# Patient Record
Sex: Male | Born: 1960 | Race: White | Hispanic: No | Marital: Single | Smoking: Never smoker
Health system: Southern US, Community
[De-identification: ages and names within clinical notes are randomized; demographics above are authoritative.]

## PROBLEM LIST (undated history)

## (undated) DIAGNOSIS — M25512 Pain in left shoulder: Secondary | ICD-10-CM

## (undated) DIAGNOSIS — I10 Essential (primary) hypertension: Secondary | ICD-10-CM

## (undated) DIAGNOSIS — T8859XA Other complications of anesthesia, initial encounter: Secondary | ICD-10-CM

## (undated) DIAGNOSIS — R0789 Other chest pain: Secondary | ICD-10-CM

## (undated) DIAGNOSIS — B3742 Candidal balanitis: Secondary | ICD-10-CM

## (undated) DIAGNOSIS — E119 Type 2 diabetes mellitus without complications: Secondary | ICD-10-CM

## (undated) DIAGNOSIS — G8911 Acute pain due to trauma: Secondary | ICD-10-CM

## (undated) DIAGNOSIS — Z87442 Personal history of urinary calculi: Secondary | ICD-10-CM

## (undated) DIAGNOSIS — IMO0002 Reserved for concepts with insufficient information to code with codable children: Secondary | ICD-10-CM

## (undated) DIAGNOSIS — R7303 Prediabetes: Secondary | ICD-10-CM

## (undated) DIAGNOSIS — K644 Residual hemorrhoidal skin tags: Secondary | ICD-10-CM

## (undated) DIAGNOSIS — E7800 Pure hypercholesterolemia, unspecified: Secondary | ICD-10-CM

## (undated) DIAGNOSIS — T4145XA Adverse effect of unspecified anesthetic, initial encounter: Secondary | ICD-10-CM

## (undated) HISTORY — DX: Candidal balanitis: B37.42

## (undated) HISTORY — DX: Prediabetes: R73.03

## (undated) HISTORY — DX: Pure hypercholesterolemia, unspecified: E78.00

## (undated) HISTORY — PX: CATARACT EXTRACTION: SUR2

## (undated) HISTORY — DX: Other chest pain: R07.89

## (undated) HISTORY — DX: Reserved for concepts with insufficient information to code with codable children: IMO0002

## (undated) HISTORY — PX: ANTERIOR CRUCIATE LIGAMENT REPAIR: SHX115

## (undated) HISTORY — PX: KNEE ARTHROSCOPY: SUR90

## (undated) HISTORY — DX: Essential (primary) hypertension: I10

## (undated) HISTORY — DX: Residual hemorrhoidal skin tags: K64.4

## (undated) HISTORY — PX: EYE SURGERY: SHX253

---

## 2000-10-20 ENCOUNTER — Emergency Department (HOSPITAL_COMMUNITY): Admission: EM | Admit: 2000-10-20 | Discharge: 2000-10-20 | Payer: Self-pay

## 2002-05-02 ENCOUNTER — Encounter: Admission: RE | Admit: 2002-05-02 | Discharge: 2002-05-02 | Payer: Self-pay | Admitting: *Deleted

## 2002-05-02 ENCOUNTER — Encounter: Payer: Self-pay | Admitting: *Deleted

## 2002-10-28 ENCOUNTER — Encounter: Admission: RE | Admit: 2002-10-28 | Discharge: 2002-10-28 | Payer: Self-pay | Admitting: Internal Medicine

## 2002-10-28 ENCOUNTER — Encounter: Payer: Self-pay | Admitting: Internal Medicine

## 2006-07-26 ENCOUNTER — Emergency Department (HOSPITAL_COMMUNITY): Admission: EM | Admit: 2006-07-26 | Discharge: 2006-07-26 | Payer: Self-pay | Admitting: Emergency Medicine

## 2008-03-14 IMAGING — CT CT ABDOMEN W/O CM
2 of 3 series · 17 of 46 positions shown, 19 images · non-contrast
Comparison: None.

HISTORY: Right flank pain.

[Series 2: routine abdomen · axial · 0.78mm/px · z∈[-470,-114]mm · 14 of 83 slices shown, 16 images]
[im 6/83  soft-tissue]
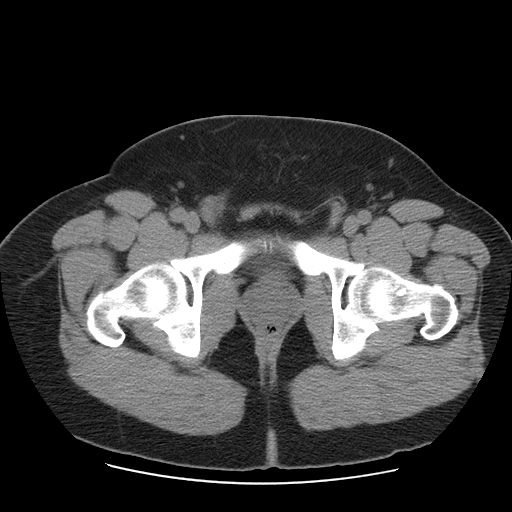
[im 6/83  bone]
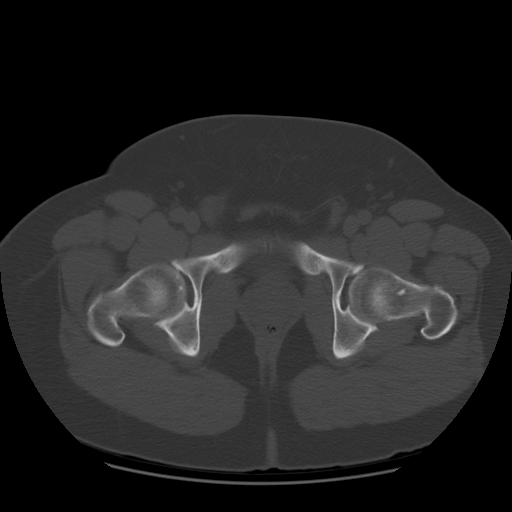
[im 11/83  soft-tissue]
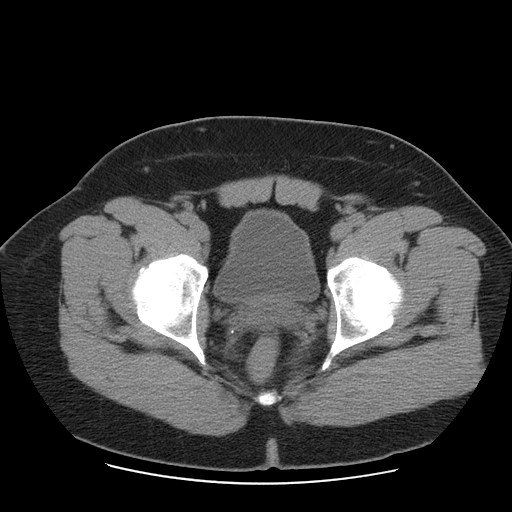
[im 16/83  soft-tissue]
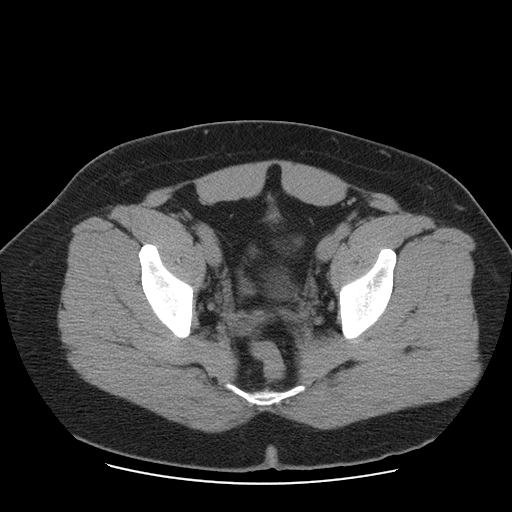
[im 22/83  soft-tissue]
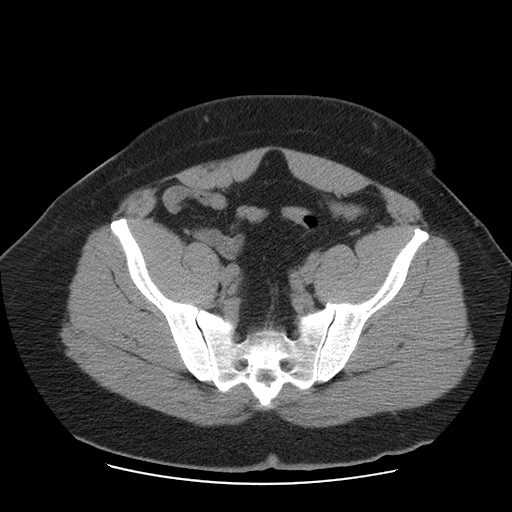
[im 27/83  soft-tissue]
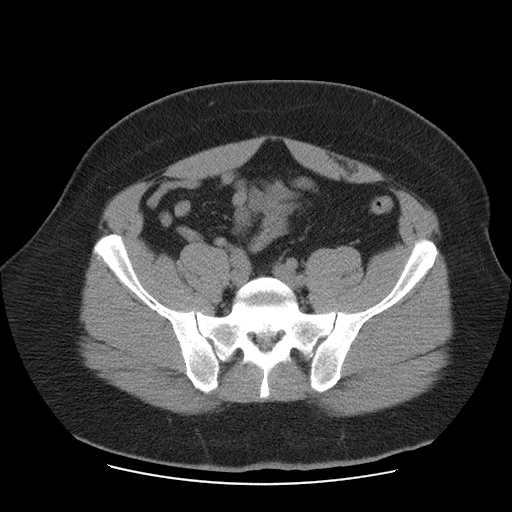
[im 32/83  soft-tissue]
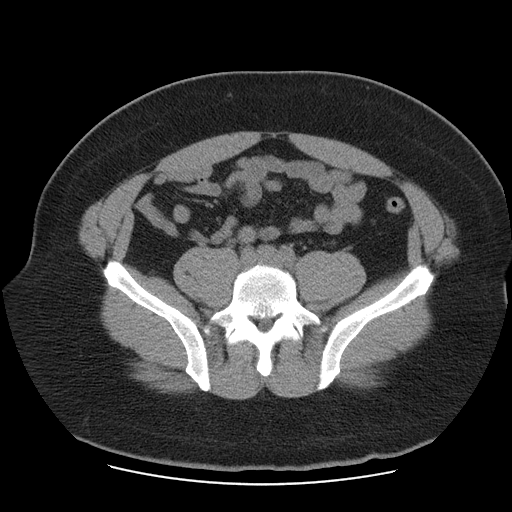
[im 38/83  soft-tissue]
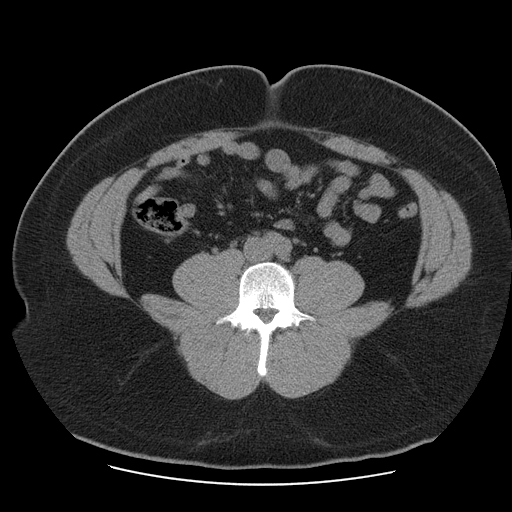
[im 45/83  soft-tissue]
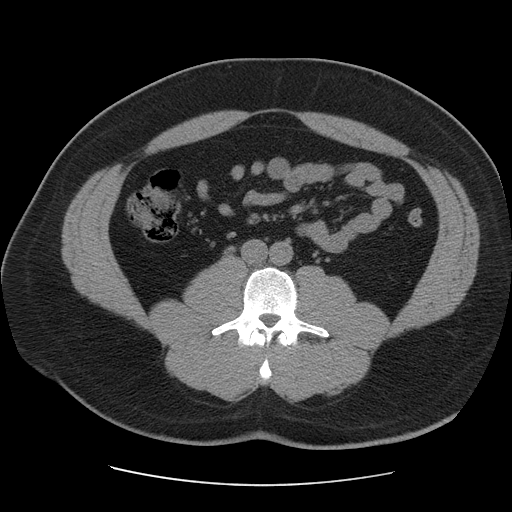
[im 51/83  soft-tissue]
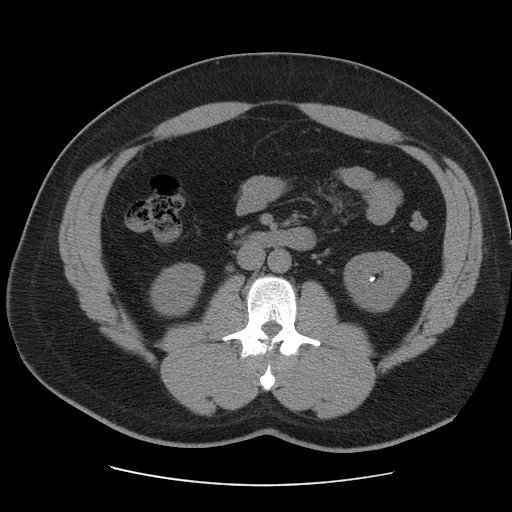
[im 51/83  bone]
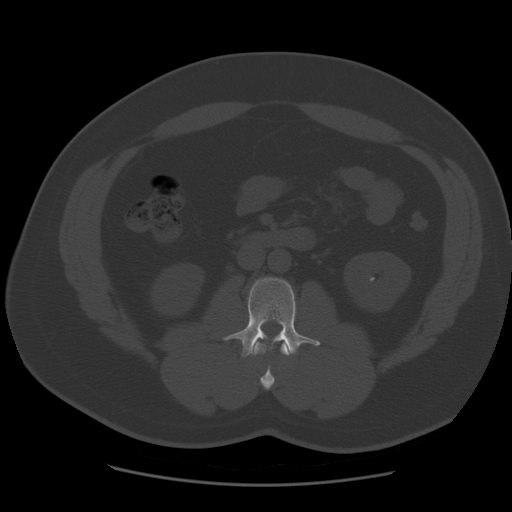
[im 56/83  soft-tissue]
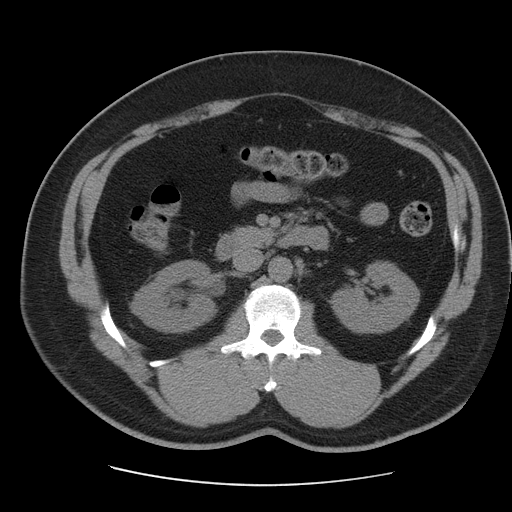
[im 61/83  soft-tissue]
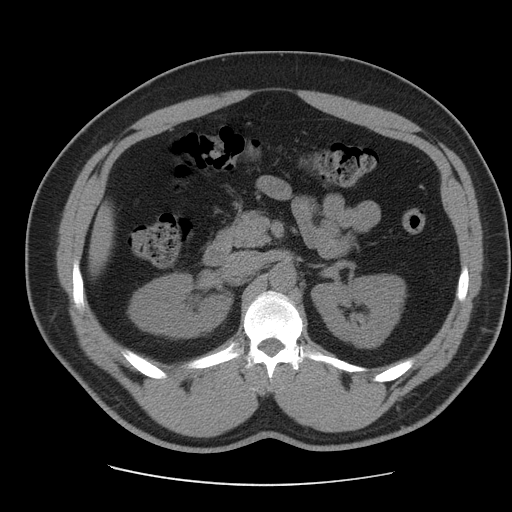
[im 67/83  soft-tissue]
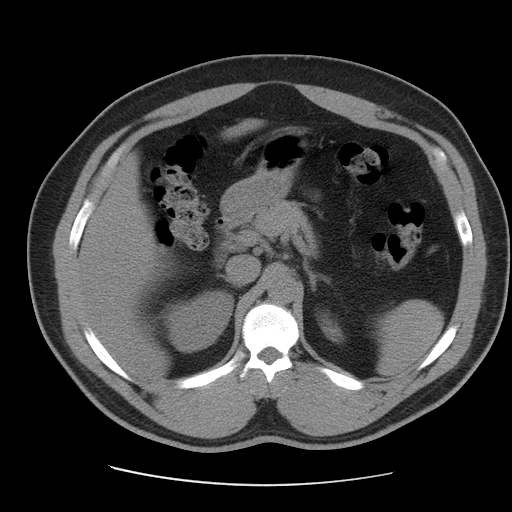
[im 72/83  soft-tissue]
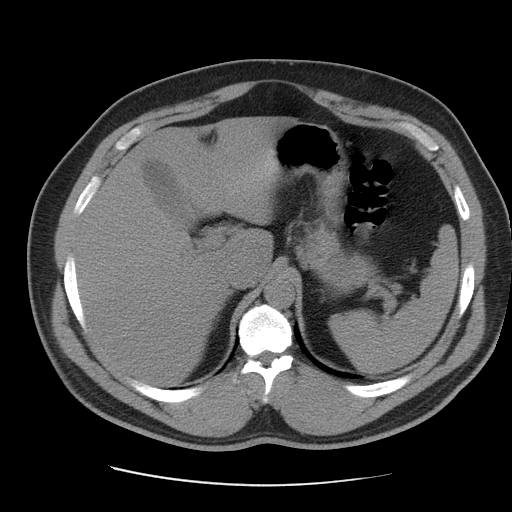
[im 77/83  soft-tissue]
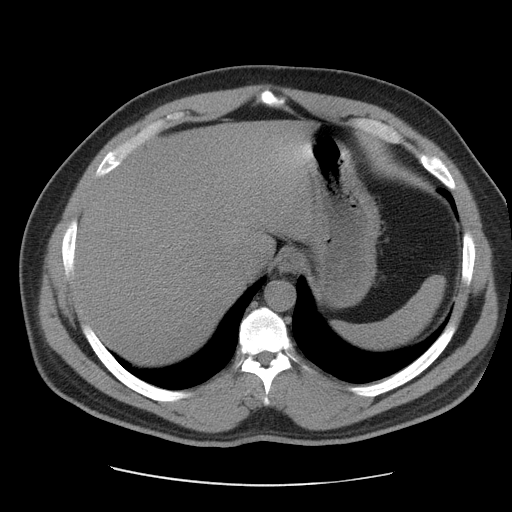

[Series 401: reformatted · coronal · 0.88mm/px · 3 of 133 slices shown]
[im 45/133  soft-tissue]
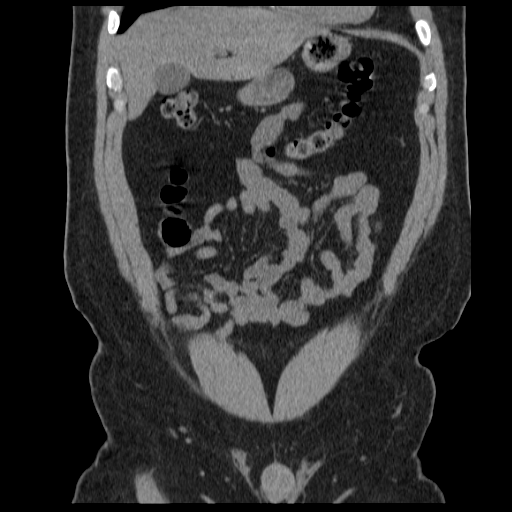
[im 59/133  soft-tissue]
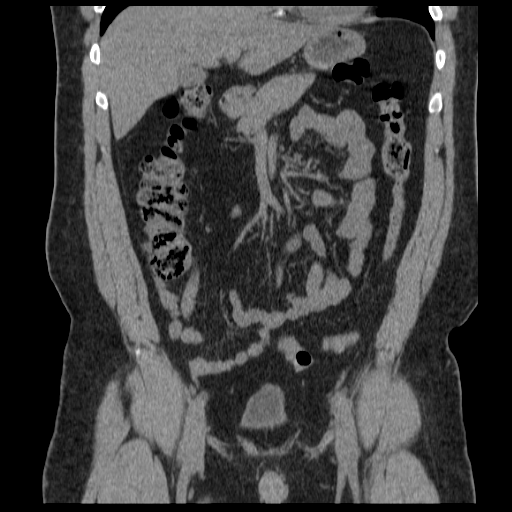
[im 74/133  soft-tissue]
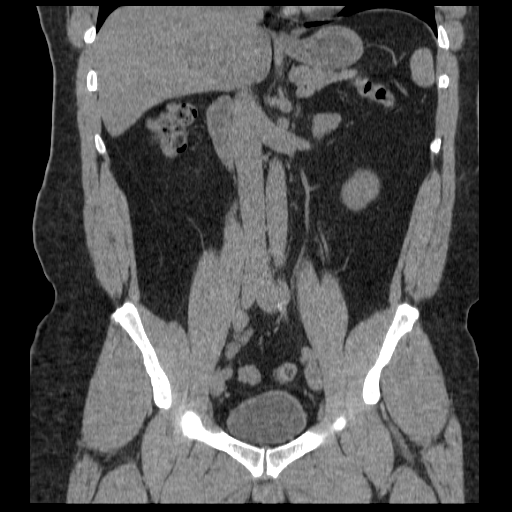

[17 of 46 positions shown; findings below may reference images not displayed]

Examination: Noncontrast CT of the abdomen pelvis was performed using the renal
stone protocol.

Abdomen CT findings: 

Lung bases are clear.

The liver is normal in attenuation and morphology.

Spleen is negative.

Both adrenal glands are negative.

Pancreas is negative.

Small stone measuring 4.7 mm in the lower pole collecting system left kidney.
Upper pole stone measures 3 mm. 

There is no left-sided hydronephrosis, hydroureter or ureterolithiasis.

Mild to moderate right-sided hydronephrosis and hydroureter. Within the urinary
bladder near the right UVJ there is a 2.8 mm stone.

Negative for free fluid.

Negative for retroperitoneal or small bowel mesenteric lymphadenopathy.

The visualized bowel loops are normal in course and caliber without evidence for
bowel obstruction.

Review of the bone windows is unremarkable.
IMPRESSION: 1. Right-sided hydronephrosis secondary to 2.8mm stone at the right UVJ.
2. Left-sided nephrolithiasis without evidence for obstructive uropathy.

Pelvic CT findings: 

2.8 mm stone is identified within the urinary bladder near the right UVJ.

No free fluid.

Negative for pelvic or inguinal lymphadenopathy.

An appendicolith is noted measuring approximately 3.2 mm. There is no evidence
for acute appendicitis.

Review of the bone windows is negative.
IMPRESSION: Bladder stone near the right UVJ

## 2009-08-13 ENCOUNTER — Ambulatory Visit: Payer: Self-pay | Admitting: General Practice

## 2009-08-25 ENCOUNTER — Ambulatory Visit: Payer: Self-pay | Admitting: General Practice

## 2010-05-02 ENCOUNTER — Encounter: Payer: Self-pay | Admitting: *Deleted

## 2010-05-13 ENCOUNTER — Ambulatory Visit: Payer: Self-pay | Admitting: Urology

## 2010-05-20 ENCOUNTER — Ambulatory Visit: Payer: Self-pay | Admitting: Urology

## 2010-06-03 ENCOUNTER — Ambulatory Visit: Payer: Self-pay | Admitting: Urology

## 2010-06-14 ENCOUNTER — Ambulatory Visit: Payer: Self-pay | Admitting: Urology

## 2010-11-01 ENCOUNTER — Ambulatory Visit: Payer: Self-pay | Admitting: Urology

## 2011-04-02 IMAGING — CR DG ABDOMEN 1V
1 series · 2 of 2 positions shown · non-contrast
Comparison: none

REASON FOR EXAM: Lower Back Pain, Chills - SEND FILM WITH PATIENT
COMMENTS:

[Series 1: view not recorded · 0.17mm/px · 2 of 2 slices shown]
[im 1/2]
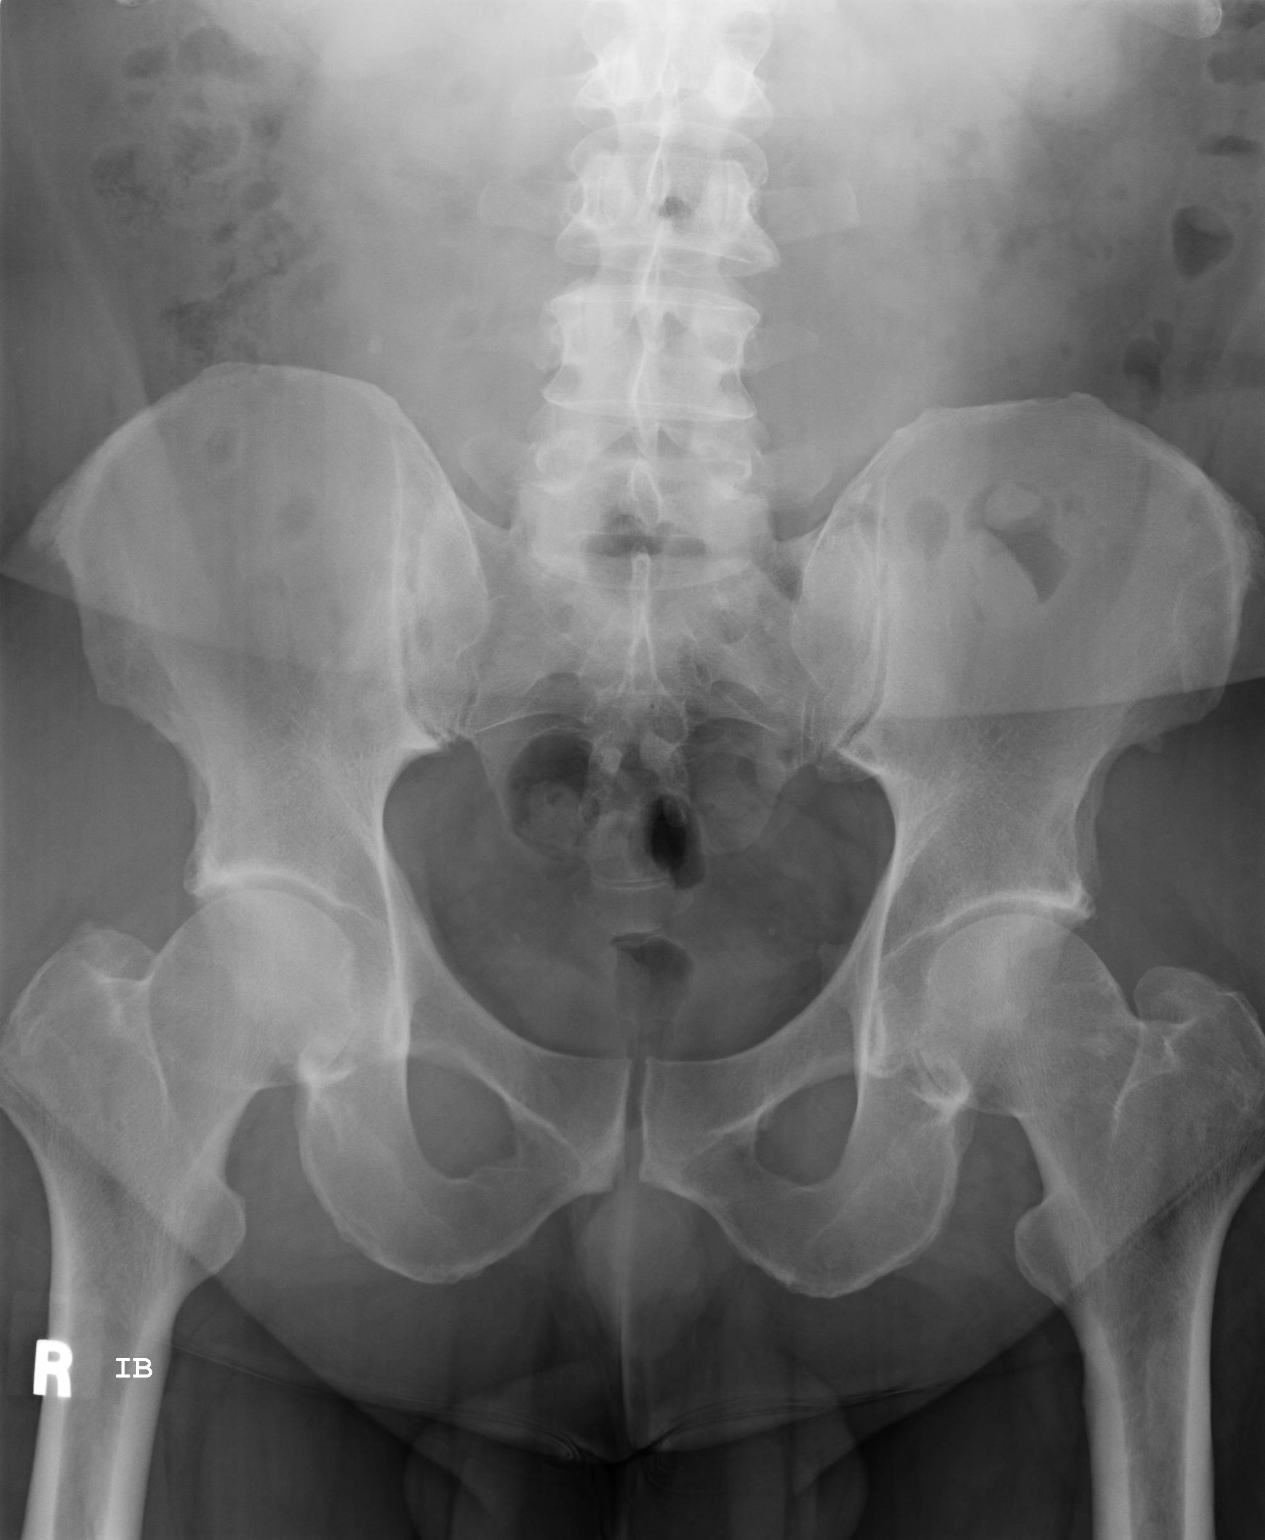
[im 2/2]
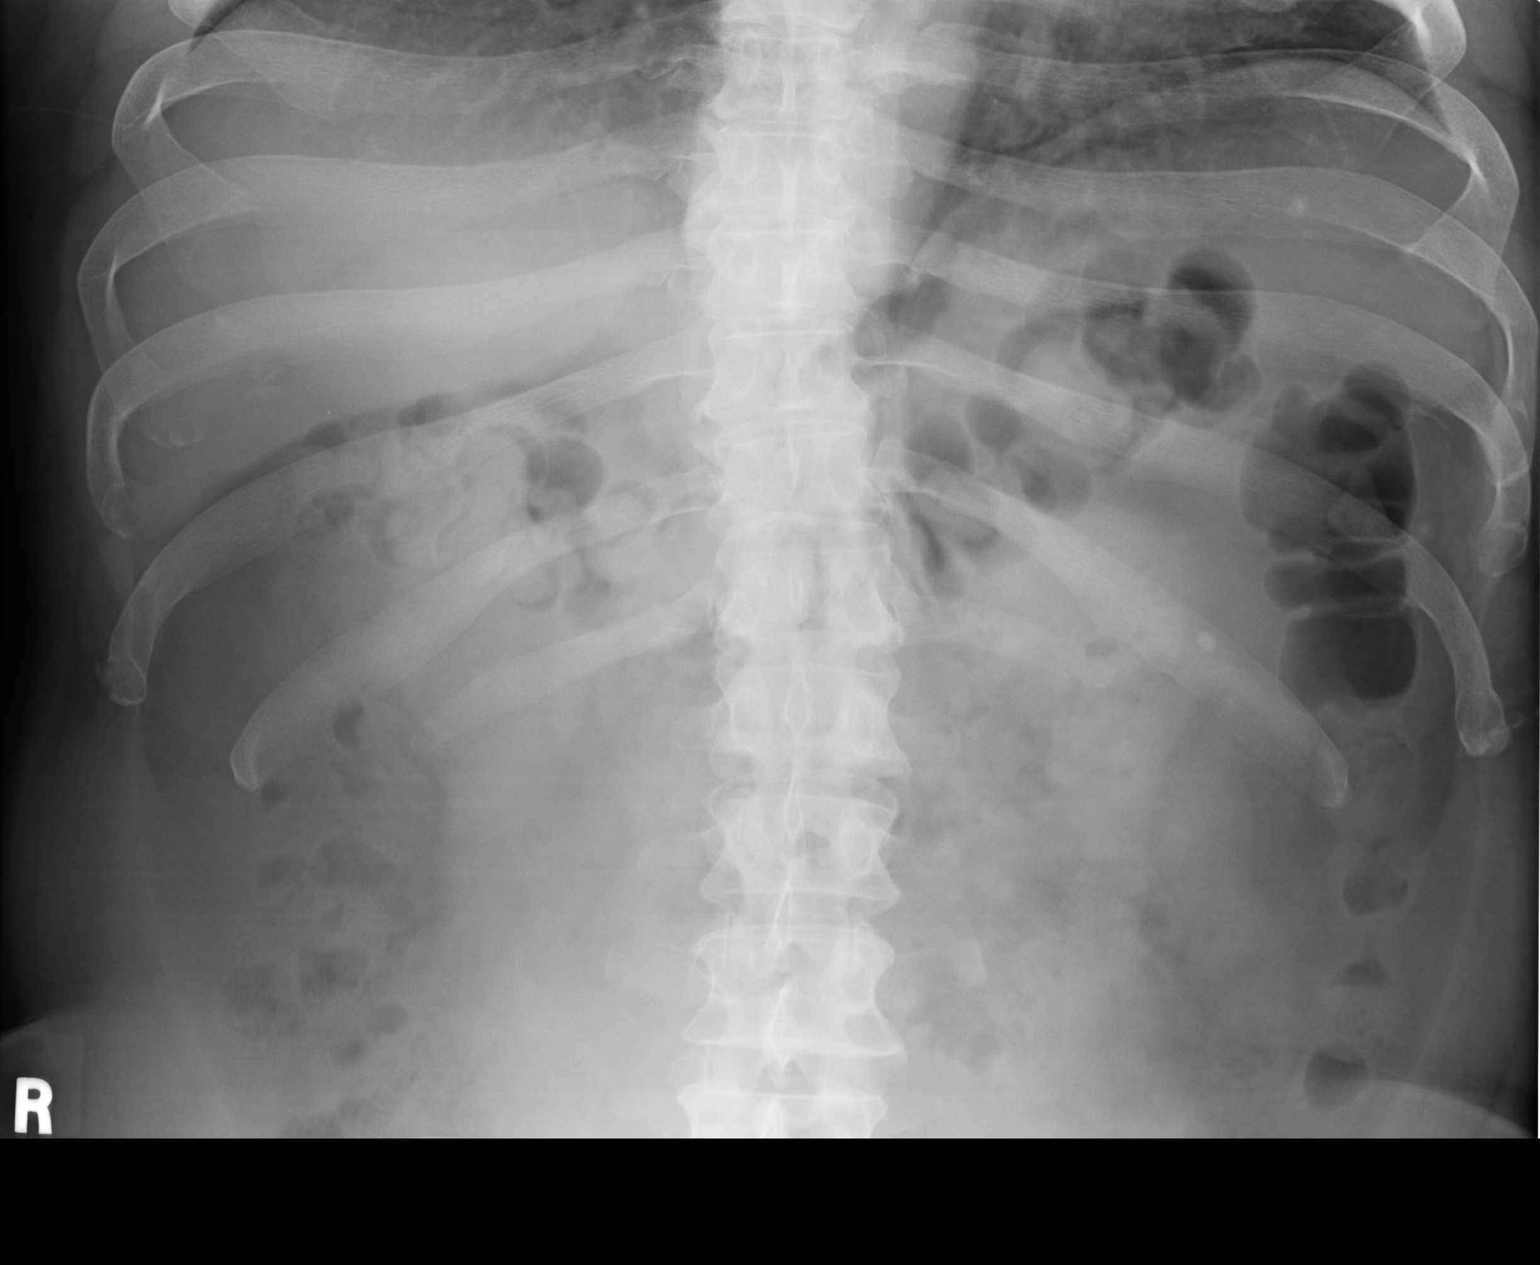

[2 of 2 positions shown; findings below may reference images not displayed]

PROCEDURE:     DXR - DXR KIDNEY URETER BLADDER  - August 13, 2009  [DATE]

RESULT:     The bowel gas pattern is within the limits of normal. There is a
rounded calcification that projects over the left eleventh rib which may
reflect a kidney stone. I see no finding suspicious for stones on the right.
There is a subtle calcific density projects just above the medial aspect of
the right iliac crest on the lower image of the study but this is not
clearly evident on the upper study. This could lie within bowel but could be
a kidney stone. There is a tiny calcification in the right aspect of the
true bony pelvis and a similar finding on the left. These likely reflect
phleboliths. No acute bony abnormality is identified.
IMPRESSION: There is a 4mm rounded calcification projecting over the
left kidney which could reflect a stone. I do not see definite evidence of
stones elsewhere on the left. On the right there are calcifications as
described above. A followup noncontrast CT scan of the abdomen and pelvis
could be considered for further evaluation.

## 2011-04-12 DIAGNOSIS — Z87442 Personal history of urinary calculi: Secondary | ICD-10-CM

## 2011-04-12 HISTORY — DX: Personal history of urinary calculi: Z87.442

## 2011-04-12 HISTORY — PX: OTHER SURGICAL HISTORY: SHX169

## 2011-05-26 ENCOUNTER — Telehealth: Payer: Self-pay | Admitting: Internal Medicine

## 2011-05-26 NOTE — Telephone Encounter (Signed)
It was Dr. Achilles Dunk at [facility].

## 2011-05-27 NOTE — Telephone Encounter (Signed)
Called pt and information was given.

## 2011-05-30 ENCOUNTER — Telehealth: Payer: Self-pay | Admitting: Internal Medicine

## 2011-05-30 DIAGNOSIS — N400 Enlarged prostate without lower urinary tract symptoms: Secondary | ICD-10-CM

## 2011-05-30 NOTE — Telephone Encounter (Signed)
Patient wants a referral put in for a Proctologist .

## 2011-05-30 NOTE — Telephone Encounter (Signed)
What is the DX?

## 2011-05-30 NOTE — Telephone Encounter (Signed)
Prostatic hypertrophy

## 2011-05-30 NOTE — Telephone Encounter (Signed)
Fine to set up referral to Dr. Achilles Dunk.

## 2011-05-31 NOTE — Telephone Encounter (Signed)
Order completed

## 2011-06-01 ENCOUNTER — Telehealth: Payer: Self-pay | Admitting: Internal Medicine

## 2011-06-01 NOTE — Telephone Encounter (Signed)
MADE PATIENT AN APPOINTMENT WITH DR STORIOFF AT [facility].  CALLED THE PATIENT AND HE SAID HE DOES NOT NEED TO SEE A UROLOGIST OR PROCTOLOGIST HE HAS HEMORROIDS AND WANTS TO BE SEEN BY A DOCTOR FOR THAT

## 2011-06-03 ENCOUNTER — Telehealth: Payer: Self-pay | Admitting: Internal Medicine

## 2011-06-09 NOTE — Telephone Encounter (Signed)
See other phone note

## 2011-06-09 NOTE — Telephone Encounter (Signed)
Spoke w/pt - he has never seen anyone regarding hemorrhoids in the past. I scheduled pt for acute visit tomorrow AM. He is clear that this is quick visit for eval of hemorrhoids and to determine if referral to surgeon/GI is needed.

## 2011-06-10 ENCOUNTER — Ambulatory Visit (INDEPENDENT_AMBULATORY_CARE_PROVIDER_SITE_OTHER): Payer: 59 | Admitting: Internal Medicine

## 2011-06-10 ENCOUNTER — Encounter: Payer: Self-pay | Admitting: Internal Medicine

## 2011-06-10 VITALS — BP 130/78 | HR 86 | Temp 98.2°F | Ht 66.0 in | Wt 222.0 lb

## 2011-06-10 DIAGNOSIS — K644 Residual hemorrhoidal skin tags: Secondary | ICD-10-CM

## 2011-06-10 HISTORY — DX: Residual hemorrhoidal skin tags: K64.4

## 2011-06-10 MED ORDER — HYDROCORTISONE ACE-PRAMOXINE 2.5-1 % RE CREA: TOPICAL_CREAM | Freq: Three times a day (TID) | RECTAL | Status: AC

## 2011-06-10 NOTE — Progress Notes (Signed)
  Subjective:    Patient ID: Michael Soto, male    DOB: [birth year], 51 y.o.   MRN: [id number]  HPI 51 year old male presents for acute visit to address hemorrhoids. Patient notes that he first developed rectal pain approximately 6 weeks ago. He has noted some bulging from his anus. He has had occasional episodes of bright red blood on the tissue paper when he wipes. He denies any constipation or difficulty passing stool. He notes that he has been applying hydrocortisone cream, Neosporin, and using Tucks wipes with some improvement in his symptoms. He has also been changing his diet and trying to increase intake of fiber to help soften the stool.  Outpatient Encounter Prescriptions as of 06/10/2011  Medication Sig Dispense Refill  . Multiple Vitamins-Minerals (MULTIVITAMIN WITH MINERALS) tablet Take 1 tablet by mouth daily.      . hydrocortisone-pramoxine [facility] SINGLES) 2.5-1 % rectal cream Place rectally 3 (three) times daily.  30 g  0    Review of Systems  Constitutional: Negative for fever, chills and fatigue.  Respiratory: Negative for shortness of breath.   Cardiovascular: Negative for chest pain.  Gastrointestinal: Positive for anal bleeding and rectal pain. Negative for nausea, vomiting, abdominal pain, diarrhea, constipation, blood in stool and abdominal distention.   BP 130/78  Pulse 86  Temp(Src) 98.2 F (36.8 C) (Oral)  Ht 5\' 6"  (1.676 m)  Wt 222 lb (100.699 kg)  BMI 35.83 kg/m2  SpO2 97%     Objective:   Physical Exam  Constitutional: He appears well-developed and well-nourished. No distress.  HENT:  Head: Normocephalic and atraumatic.  Eyes: EOM are normal. Pupils are equal, round, and reactive to light.  Neck: Normal range of motion. Neck supple.  Pulmonary/Chest: Effort normal.  Genitourinary:       Pt refuses rectal exam  Skin: He is not diaphoretic.  Psychiatric: He has a normal mood and affect. His behavior is normal. Judgment and thought content normal.            Assessment & Plan:

## 2011-06-10 NOTE — Patient Instructions (Signed)

## 2011-06-10 NOTE — Assessment & Plan Note (Signed)
Symptoms most consistent with external hemorrhoid. Patient refused rectal exam today. Will try adding Analpram to see if any improvement. He will continue to use Tucks pads and pre-moistened toilet paper. He will continue to increase intake of fiber in his diet to help soften stool. If no improvement, patient will email or call and we will set up surgical evaluation.

## 2011-07-06 ENCOUNTER — Telehealth: Payer: Self-pay | Admitting: Internal Medicine

## 2011-07-06 NOTE — Telephone Encounter (Signed)
error 

## 2011-07-14 ENCOUNTER — Encounter: Payer: Self-pay | Admitting: Internal Medicine

## 2011-08-24 ENCOUNTER — Telehealth: Payer: Self-pay | Admitting: Internal Medicine

## 2011-08-24 NOTE — Telephone Encounter (Signed)
Patient is coming in tomorrow. 

## 2011-08-24 NOTE — Telephone Encounter (Signed)
This pt needs to be seen for swelling in genital area, so that I can determine if referral is needed and to whom.

## 2011-08-24 NOTE — Telephone Encounter (Signed)
Patient called in and states he wants a referral to dermatologist.  He is coming in on 08/25/11. He states he called in 2 days ago in regards to this referral I don't see any documentation. He never said why he needed this referral he just said that he had recently passed a kidney stone, and he was swollen in his genital area and it just didn't look right.

## 2011-08-25 ENCOUNTER — Ambulatory Visit (INDEPENDENT_AMBULATORY_CARE_PROVIDER_SITE_OTHER): Payer: 59 | Admitting: Internal Medicine

## 2011-08-25 ENCOUNTER — Encounter: Payer: Self-pay | Admitting: Internal Medicine

## 2011-08-25 VITALS — BP 144/93 | HR 70 | Temp 98.0°F | Resp 16 | Wt 251.5 lb

## 2011-08-25 DIAGNOSIS — B3742 Candidal balanitis: Secondary | ICD-10-CM

## 2011-08-25 DIAGNOSIS — N508 Other specified disorders of male genital organs: Secondary | ICD-10-CM

## 2011-08-25 DIAGNOSIS — IMO0002 Reserved for concepts with insufficient information to code with codable children: Secondary | ICD-10-CM

## 2011-08-25 DIAGNOSIS — B3749 Other urogenital candidiasis: Secondary | ICD-10-CM

## 2011-08-25 DIAGNOSIS — K649 Unspecified hemorrhoids: Secondary | ICD-10-CM

## 2011-08-25 HISTORY — DX: Reserved for concepts with insufficient information to code with codable children: IMO0002

## 2011-08-25 HISTORY — DX: Candidal balanitis: B37.42

## 2011-08-25 MED ORDER — FLUCONAZOLE 150 MG PO TABS: 150.0000 mg | ORAL_TABLET | Freq: Once | ORAL | Status: AC

## 2011-08-25 MED ORDER — NYSTATIN 100000 UNIT/GM EX CREA: TOPICAL_CREAM | Freq: Two times a day (BID) | CUTANEOUS | Status: DC

## 2011-08-25 MED ORDER — HYDROCORTISONE ACE-PRAMOXINE 2.5-1 % RE CREA: TOPICAL_CREAM | RECTAL | Status: DC | PRN

## 2011-08-25 NOTE — Assessment & Plan Note (Signed)
Description of vesicle and now small ulcer and exam seem most consistent with herpetic lesion. Swab for herpes was sent today. There also appears to be some superficial candidiasis which was treated with Diflucan.

## 2011-08-25 NOTE — Assessment & Plan Note (Signed)
Exam is consistent with candidiasis under the foreskin. Will treat with single dose of oral Diflucan and topical nystatin. Patient will call if symptoms are not improving.

## 2011-08-25 NOTE — Progress Notes (Signed)
  Subjective:    Patient ID: Michael Soto, male    DOB: 27-Sep-1960, 51 y.o.   MRN: [id number]  HPI 51 year old male presents for acute visit complaining of lesions on his penis. He reports that a couple of weeks ago he passed a large kidney stone. After that he noted some irritation at the urethral meatus. A few days later, he noticed a blisterlike lesion at the junction of the head of the penis and the foreskin. This oozed some clear fluid. It was slightly painful to touch. There is now an ulcerated area at the site. He reports the area is slightly itchy. He denies any history of herpes. He has not been sexually active in many years. He denies any fever or chills. He otherwise reports he is feeling well.  Outpatient Encounter Prescriptions as of 08/25/2011  Medication Sig Dispense Refill  . hydrocortisone-pramoxine (ANALPRAM-HC) 2.5-1 % rectal cream Place rectally as needed. Hemorrhoids.  30 g  1  . Multiple Vitamins-Minerals (MULTIVITAMIN WITH MINERALS) tablet Take 1 tablet by mouth daily.      Marland Kitchen DISCONTD: hydrocortisone-pramoxine (ANALPRAM-HC) 2.5-1 % rectal cream Place rectally as needed. Hemorrhoids.      . fluconazole (DIFLUCAN) 150 MG tablet Take 1 tablet (150 mg total) by mouth once.  1 tablet  0  . nystatin cream (MYCOSTATIN) Apply topically 2 (two) times daily.  30 g  0    Review of Systems  Constitutional: Negative for fever, chills, activity change, appetite change, fatigue and unexpected weight change.  Gastrointestinal: Negative for abdominal pain.  Genitourinary: Positive for hematuria, genital sores and penile pain. Negative for dysuria, urgency, flank pain, scrotal swelling, difficulty urinating and testicular pain.  Skin: Positive for color change and wound. Negative for rash.      BP 144/93  Pulse 70  Temp(Src) 98 F (36.7 C) (Oral)  Resp 16  Wt 251 lb 8 oz (114.08 kg)  SpO2 97%  Objective:   Physical Exam  Constitutional: He appears well-developed and  well-nourished. No distress.  HENT:  Head: Normocephalic and atraumatic.  Eyes: Pupils are equal, round, and reactive to light.  Neck: Normal range of motion.  Pulmonary/Chest: Effort normal.  Genitourinary:    Uncircumcised. Penile erythema present. No discharge found.  Skin: He is not diaphoretic.          Assessment & Plan:

## 2011-08-26 ENCOUNTER — Other Ambulatory Visit: Payer: Self-pay | Admitting: Internal Medicine

## 2011-08-26 NOTE — Progress Notes (Signed)
Addended by: Jobie Quaker on: 08/26/2011 04:17 PM   Modules accepted: Orders

## 2011-08-30 LAB — GC/CHLAMYDIA PROBE AMP, GENITAL
Chlamydia, DNA Probe: NEGATIVE
GC Probe Amp, Genital: NEGATIVE

## 2011-09-07 ENCOUNTER — Telehealth: Payer: Self-pay | Admitting: Internal Medicine

## 2011-09-07 NOTE — Telephone Encounter (Signed)
error 

## 2011-09-13 ENCOUNTER — Other Ambulatory Visit: Payer: Self-pay | Admitting: Internal Medicine

## 2011-09-16 ENCOUNTER — Encounter: Payer: Self-pay | Admitting: Internal Medicine

## 2011-09-16 ENCOUNTER — Ambulatory Visit (INDEPENDENT_AMBULATORY_CARE_PROVIDER_SITE_OTHER): Payer: 59 | Admitting: Internal Medicine

## 2011-09-16 VITALS — BP 112/74 | HR 71 | Temp 98.6°F | Wt 242.0 lb

## 2011-09-16 DIAGNOSIS — B3749 Other urogenital candidiasis: Secondary | ICD-10-CM

## 2011-09-16 DIAGNOSIS — B3742 Candidal balanitis: Secondary | ICD-10-CM

## 2011-09-16 MED ORDER — MICONAZOLE NITRATE 4 % VA CREA: 1.0000 "application " | TOPICAL_CREAM | Freq: Every day | VAGINAL | Status: DC

## 2011-09-16 MED ORDER — FLUCONAZOLE 150 MG PO TABS: 150.0000 mg | ORAL_TABLET | Freq: Once | ORAL | Status: AC

## 2011-09-16 NOTE — Assessment & Plan Note (Signed)
Exam is most consistent with persisting candidiasis under the foreskin. Will try changing treatment to miconazole. We'll also repeat course of Diflucan. Patient will set up followup with urologist for additional evaluation if symptoms are not improving.

## 2011-09-16 NOTE — Progress Notes (Signed)
  Subjective:    Patient ID: Michael Soto, male    DOB: 11/06/1960, 51 y.o.   MRN: [id number]  HPI 51 year old male with history of genital rash presents for followup. He reports that redness around the foreskin improve somewhat with use of Diflucan and nystatin cream. However, itchiness and some redness has recurred. He was reading online about potential treatments and tried bathing in a bath with vinegar. He reports that this caused more irritation. He then read a report about applying diluted Listerine to the area. He tried this and feels that there was some improvement. He denies any pain in his groin, swollen lymph nodes, penile discharge, dysuria, fever, chills.  Outpatient Encounter Prescriptions as of 09/16/2011  Medication Sig Dispense Refill  . hydrocortisone-pramoxine (ANALPRAM-HC) 2.5-1 % rectal cream Place rectally as needed. Hemorrhoids.  30 g  1  . Multiple Vitamins-Minerals (MULTIVITAMIN WITH MINERALS) tablet Take 1 tablet by mouth daily.      Marland Kitchen nystatin cream (MYCOSTATIN) APPLY TOPICALLY 2 (TWO) TIMES DAILY.  30 g  0  . fluconazole (DIFLUCAN) 150 MG tablet Take 1 tablet (150 mg total) by mouth once.  3 tablet  0  . MICONAZOLE NITRATE VAGINAL (MICONAZOLE 3) 4 % CREA Apply 1 application topically daily. Apply daily for 7 days  25 g  0    Review of Systems  Constitutional: Negative for fever, chills and fatigue.  Gastrointestinal: Negative for abdominal pain.  Genitourinary: Positive for penile pain. Negative for urgency, decreased urine volume, discharge, penile swelling, scrotal swelling, genital sores and testicular pain.  Skin: Positive for color change and rash.   BP 112/74  Pulse 71  Temp 98.6 F (37 C)  Wt 242 lb (109.77 kg)  SpO2 98%     Objective:   Physical Exam  Constitutional: He appears well-developed and well-nourished. No distress.  HENT:  Head: Normocephalic and atraumatic.  Pulmonary/Chest: Effort normal.  Genitourinary: Uncircumcised. Penile erythema  (with satellite lesions over head of penis and foreskin) present. No penile tenderness. No discharge found.  Skin: He is not diaphoretic.          Assessment & Plan:

## 2012-01-07 IMAGING — CR DG ABDOMEN 1V
1 series · 2 of 2 positions shown · non-contrast
Comparison: none

REASON FOR EXAM: Calculus
COMMENTS:

[Series 1: view not recorded · 0.17mm/px · 2 of 2 slices shown]
[im 1/2]
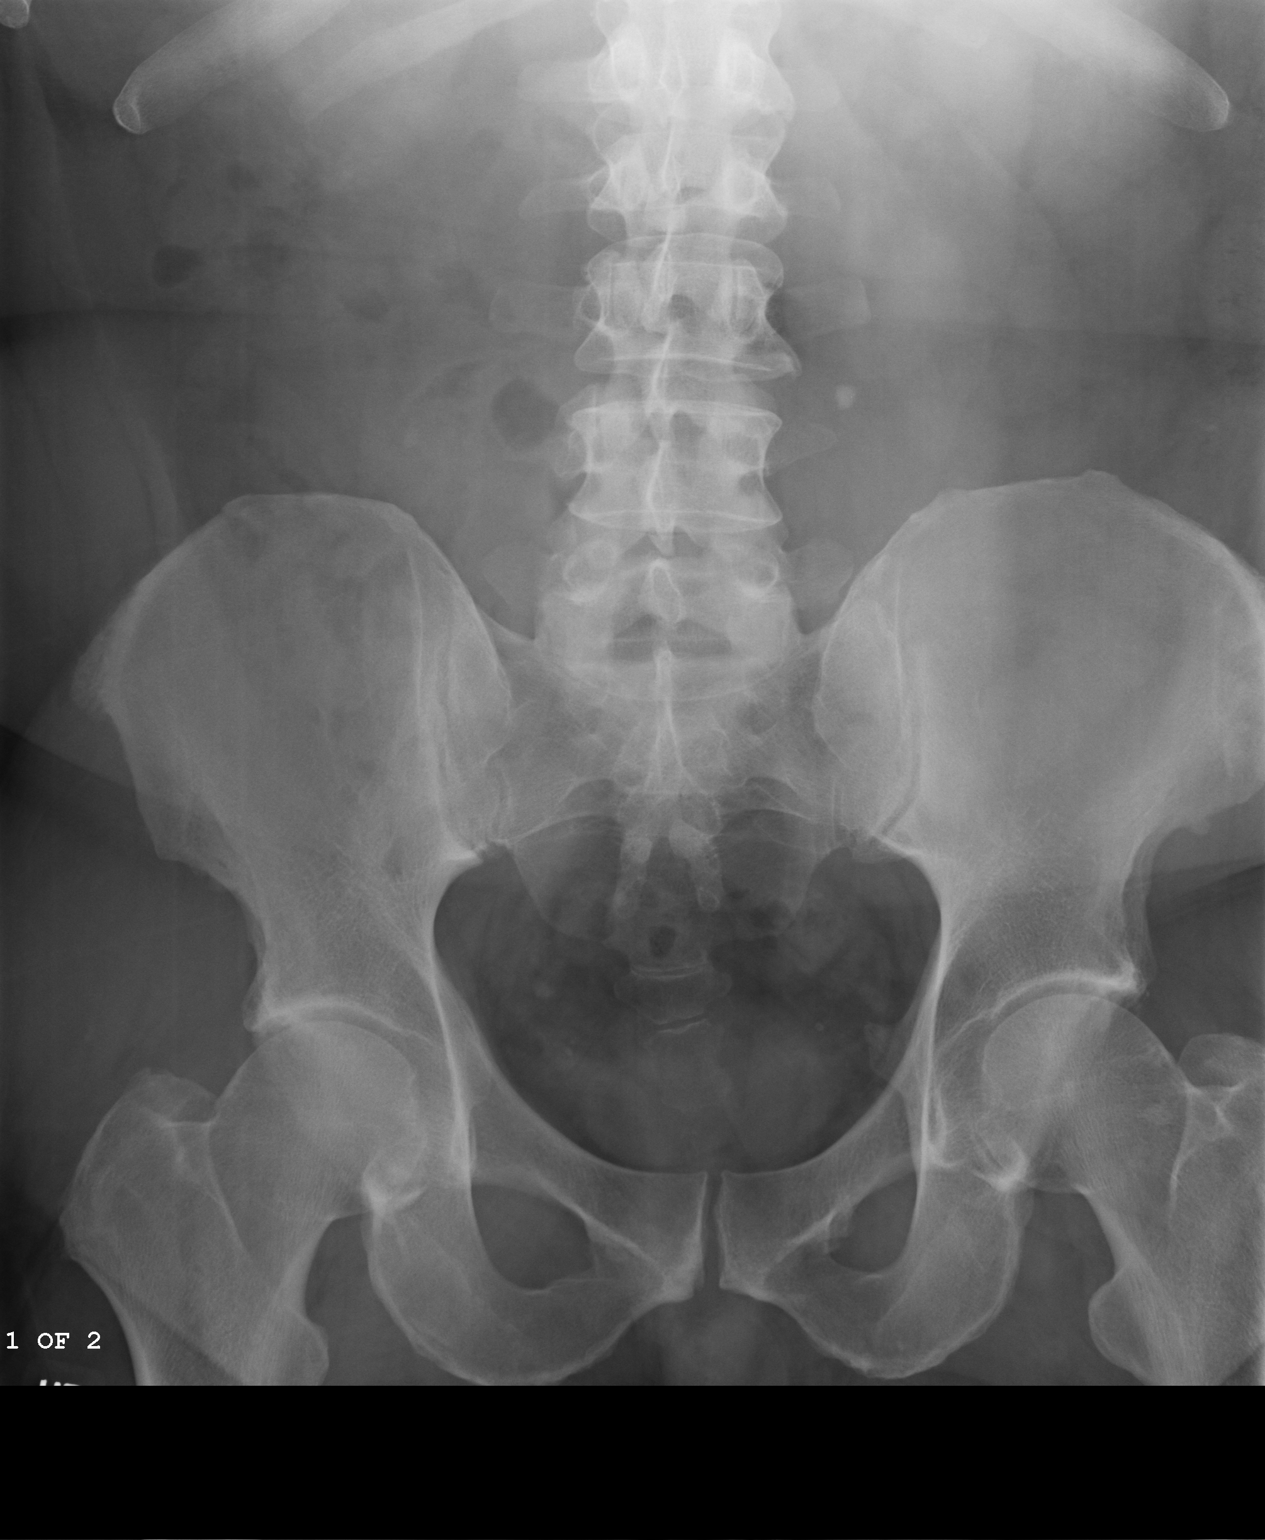
[im 2/2]
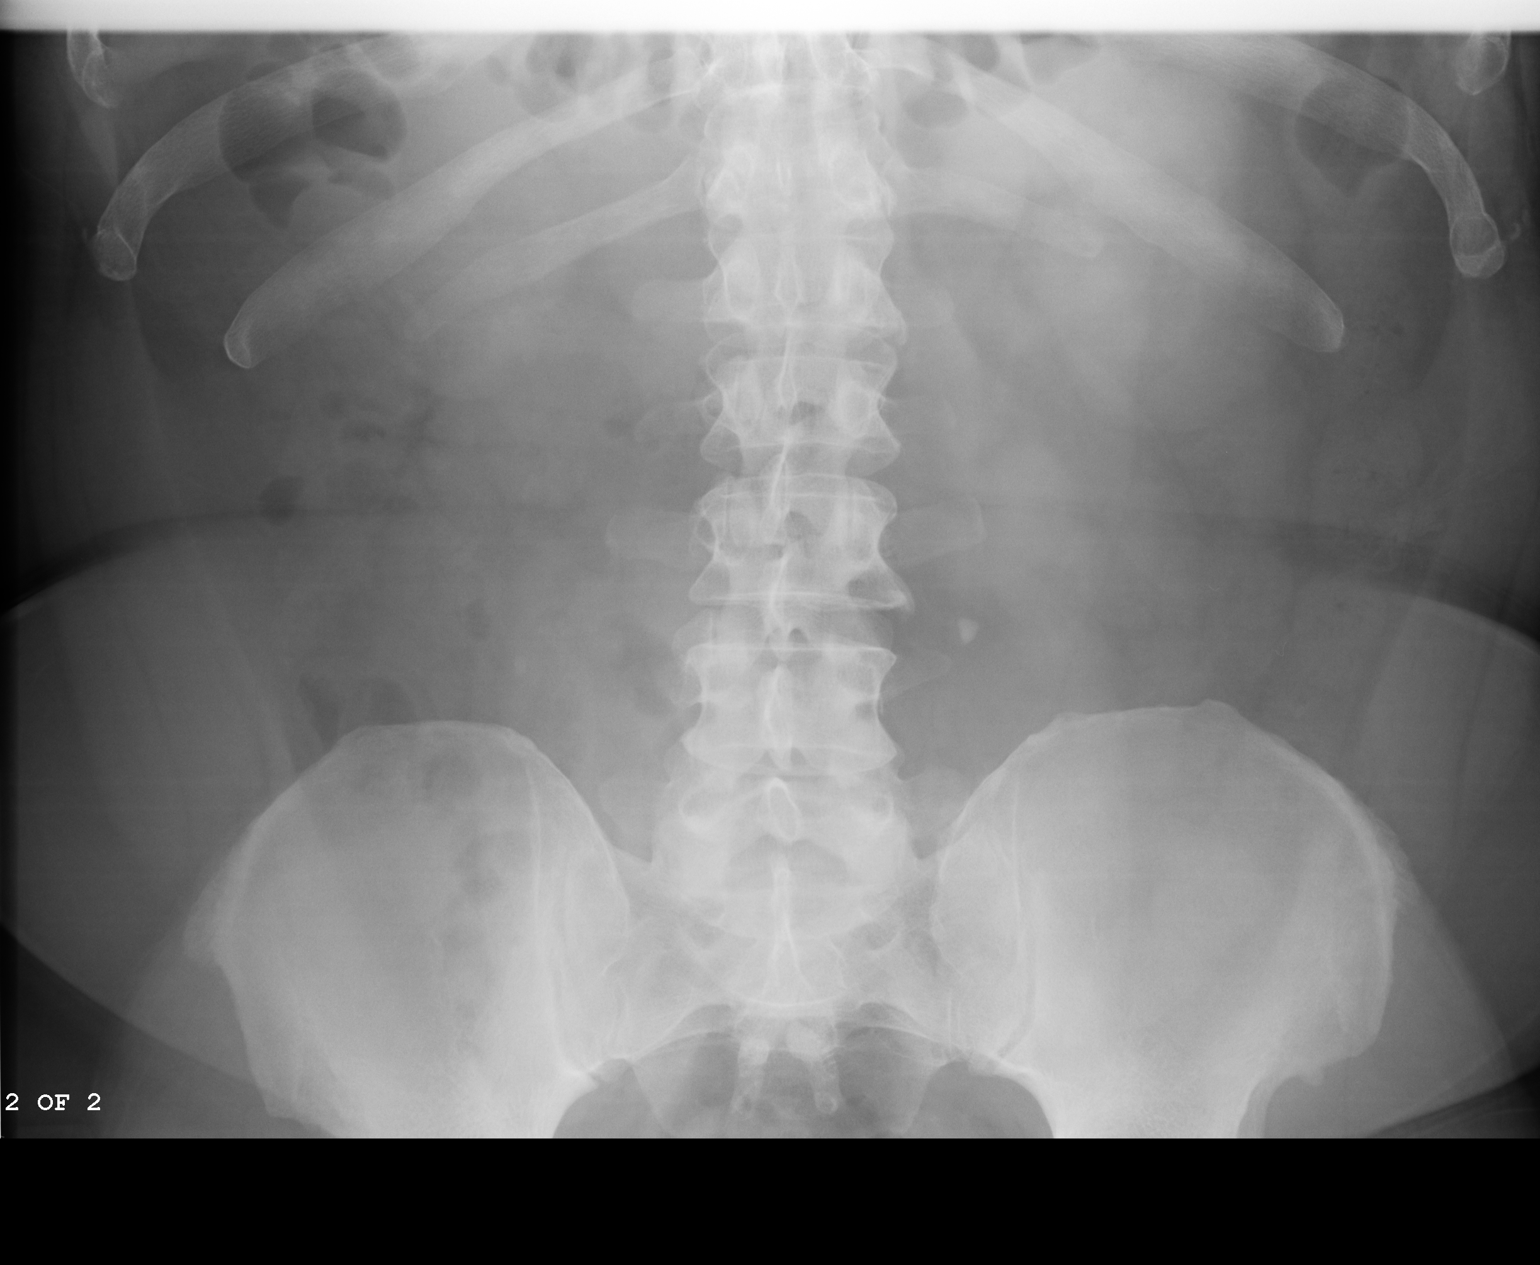

[2 of 2 positions shown; findings below may reference images not displayed]

PROCEDURE:     DXR - DXR KIDNEY URETER BLADDER  - May 20, 2010  [DATE]

RESULT:     Comparison is made to a prior study dated 05/13/2010.

Calcified density once again projects in the expected course of the mid to
distal right ureter between the transverse process of L3 and L4 at the L3-L4
disc base level. This is on the left. These findings are unchanged when
compared to the previous study. Air is seen within nondilated loops of large
and small bowel. The visualized bony skeleton is unremarkable.
IMPRESSION: Persistent density projecting along the expected course of the left ureter
suspicious for a distal left ureteral calculus.

## 2012-02-01 IMAGING — CR DG ABDOMEN 1V
1 series · 2 of 2 positions shown · non-contrast
Comparison: none

REASON FOR EXAM: Nephrolithiasis
COMMENTS:

[Series 1: view not recorded · 0.17mm/px · 2 of 2 slices shown]
[im 1/2]
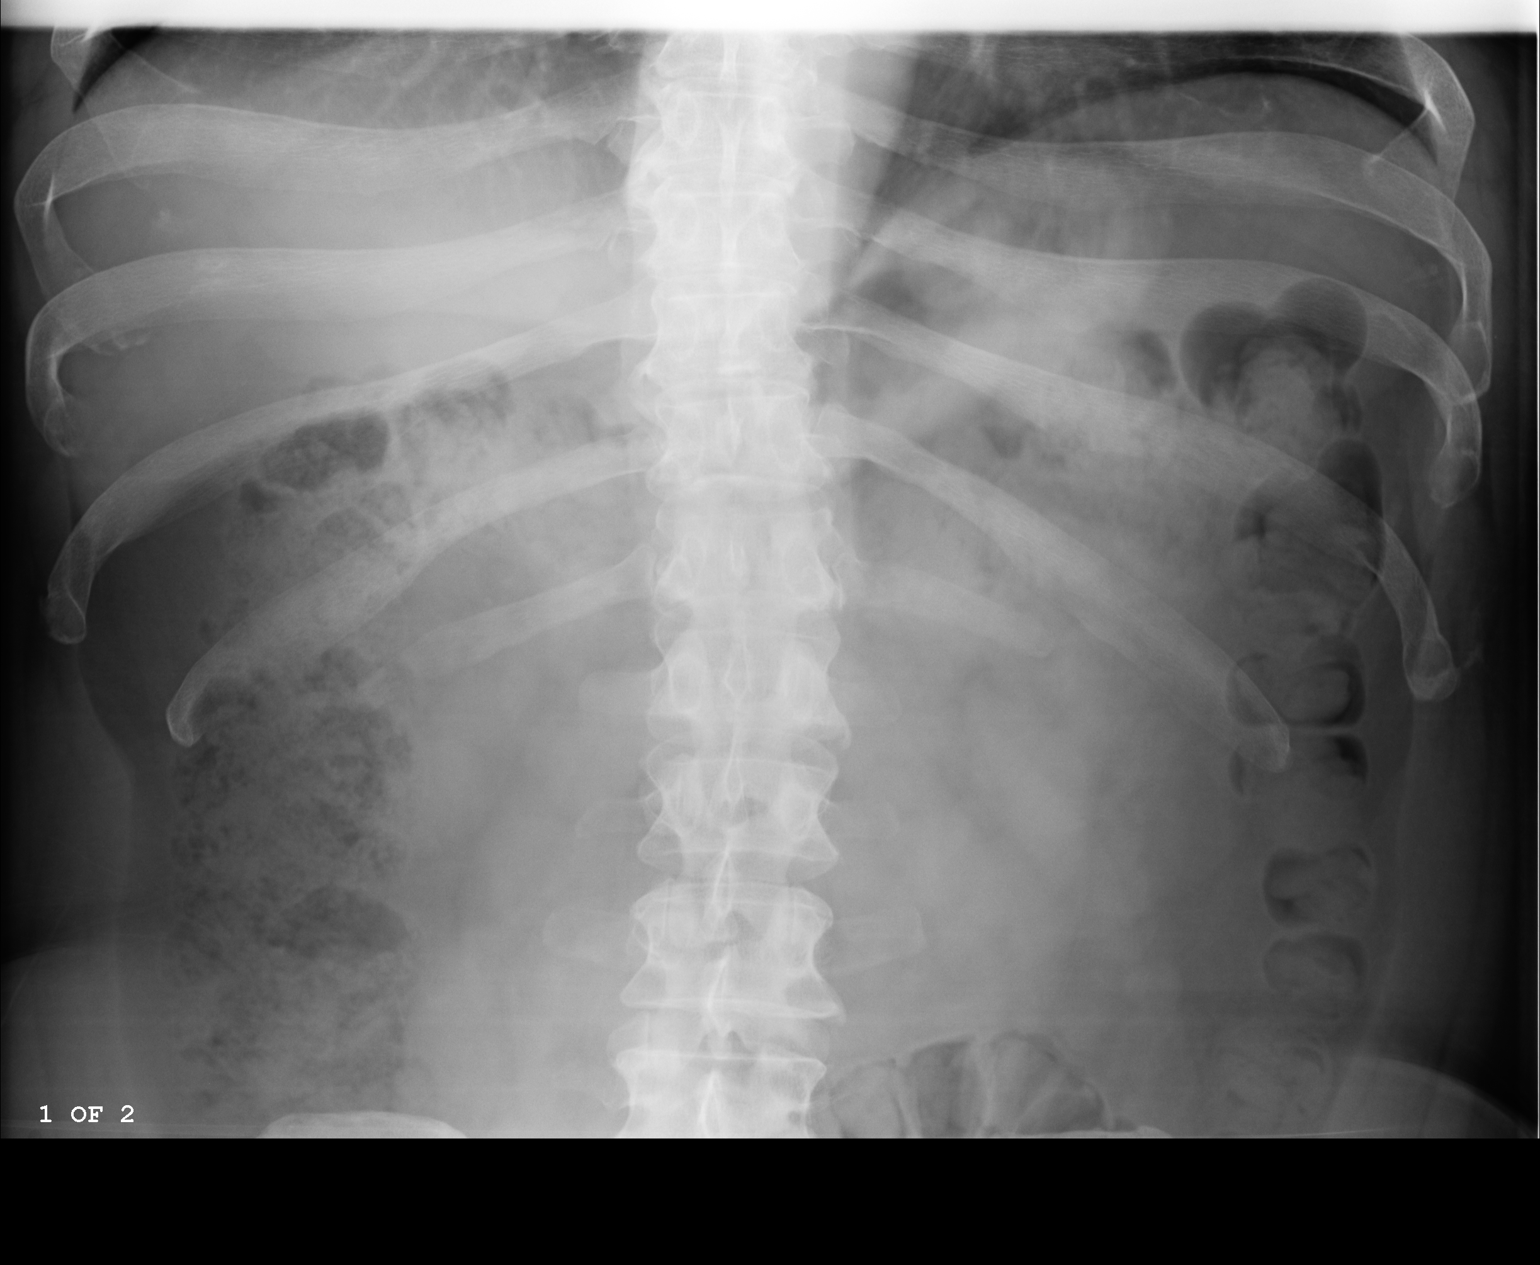
[im 2/2]
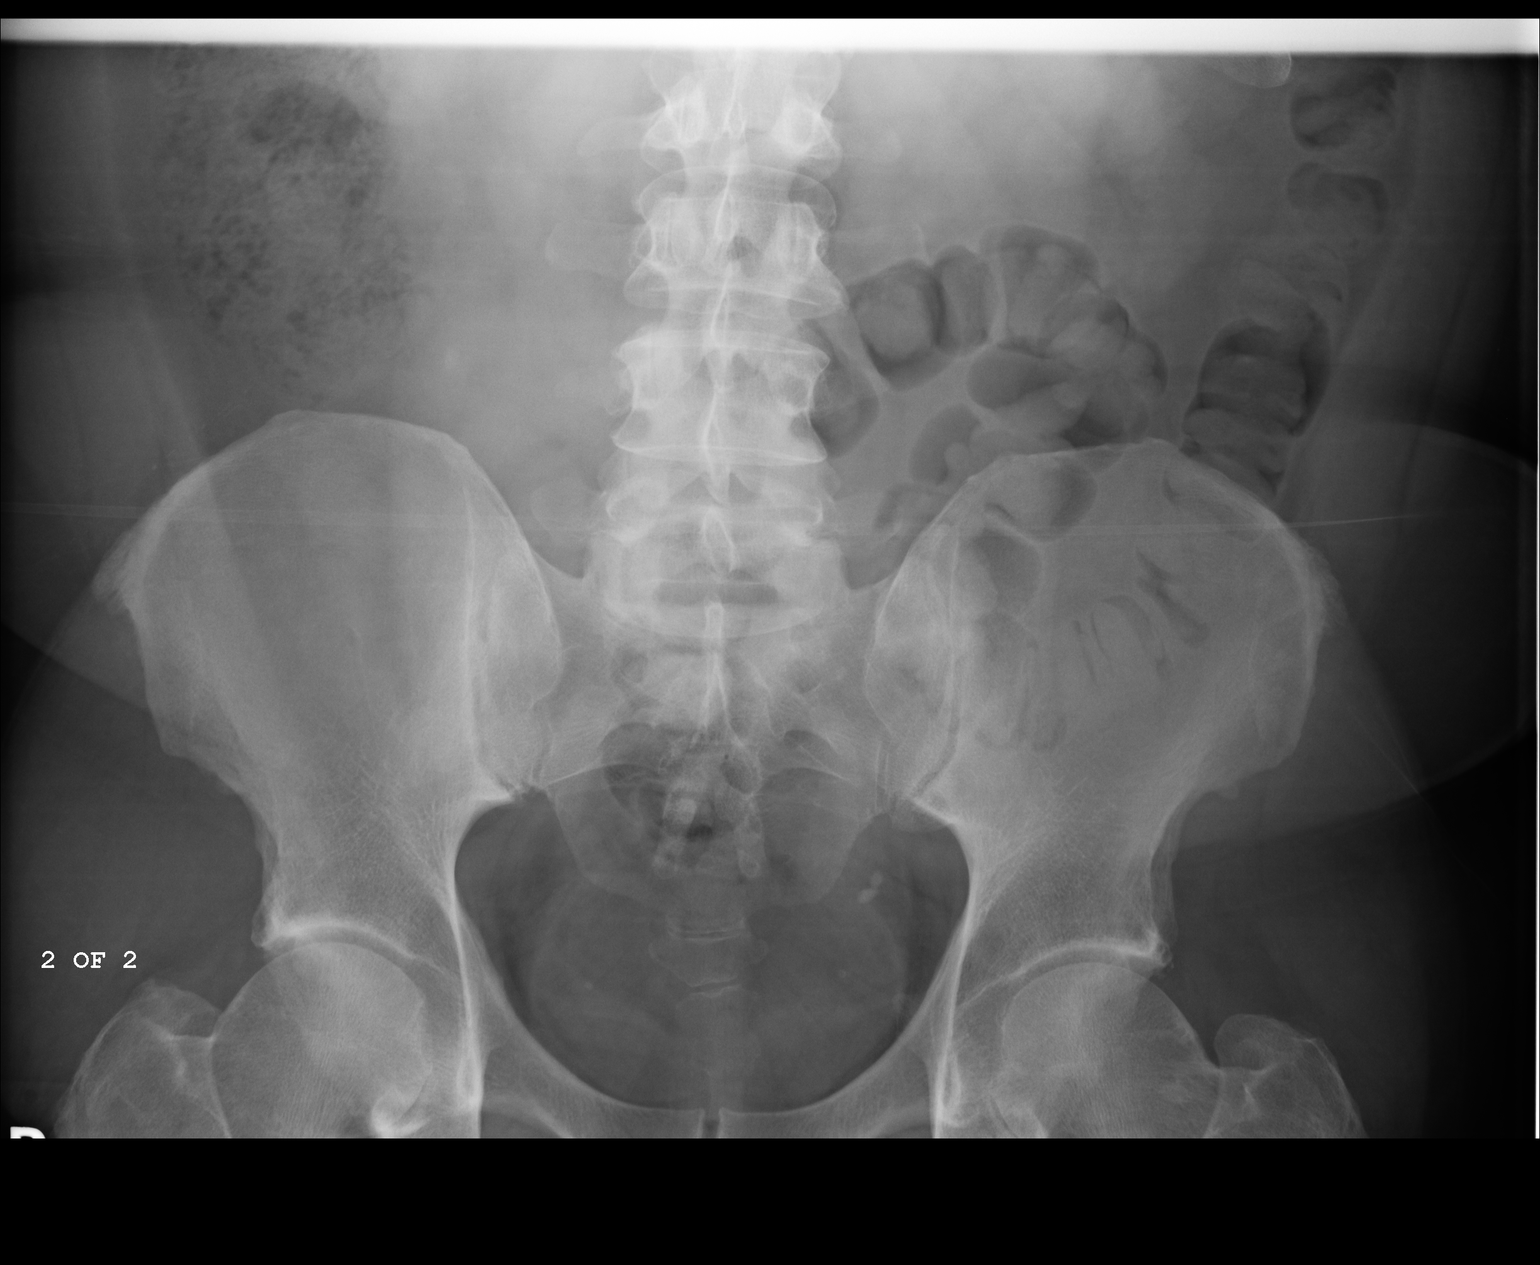

[2 of 2 positions shown; findings below may reference images not displayed]

PROCEDURE:     DXR - DXR KIDNEY URETER BLADDER  - June 14, 2010  [DATE]

RESULT:     Frontal view of the abdomen and pelvis is performed. Comparison
is made to a prior study dated 06/03/2010.

A moderate to large amount of stool is appreciated within the colon. There
does not appear to be evidence of calcified densities projecting in the
region of the right or left renal fossa areas. Rounded densities project
within the superior-lateral portion of the true pelvis on the left.
Differential considerations are phleboliths versus possibly distal ureteral
calculi. The visualized bony skeleton is unremarkable.
IMPRESSION: 1.     Phleboliths versus distal ureteral calculi in the upper lateral
portion of the true pelvis region.
2.     Nonobstructive bowel gas pattern with a moderate to large amount of
stool.

## 2015-12-07 ENCOUNTER — Emergency Department (HOSPITAL_COMMUNITY)
Admission: EM | Admit: 2015-12-07 | Discharge: 2015-12-07 | Disposition: A | Discharge status: Home / Self Care | Payer: 59 | Attending: Emergency Medicine | Admitting: Emergency Medicine

## 2015-12-07 ENCOUNTER — Encounter (HOSPITAL_COMMUNITY): Payer: Self-pay | Admitting: Emergency Medicine

## 2015-12-07 ENCOUNTER — Emergency Department (HOSPITAL_COMMUNITY): Payer: 59

## 2015-12-07 DIAGNOSIS — N1330 Unspecified hydronephrosis: Secondary | ICD-10-CM | POA: Insufficient documentation

## 2015-12-07 DIAGNOSIS — R1032 Left lower quadrant pain: Secondary | ICD-10-CM | POA: Diagnosis present

## 2015-12-07 DIAGNOSIS — N23 Unspecified renal colic: Secondary | ICD-10-CM

## 2015-12-07 DIAGNOSIS — N201 Calculus of ureter: Secondary | ICD-10-CM | POA: Diagnosis not present

## 2015-12-07 LAB — CBC WITH DIFFERENTIAL/PLATELET
Basophils Absolute: 0 10*3/uL (ref 0.0–0.1)
Basophils Relative: 0 %
Eosinophils Absolute: 0 10*3/uL (ref 0.0–0.7)
Eosinophils Relative: 0 %
HCT: 48.1 % (ref 39.0–52.0)
Hemoglobin: 16.1 g/dL (ref 13.0–17.0)
Lymphocytes Relative: 15 %
Lymphs Abs: 1.8 10*3/uL (ref 0.7–4.0)
MCH: 30.3 pg (ref 26.0–34.0)
MCHC: 33.5 g/dL (ref 30.0–36.0)
MCV: 90.4 fL (ref 78.0–100.0)
Monocytes Absolute: 1 10*3/uL (ref 0.1–1.0)
Monocytes Relative: 8 %
Neutro Abs: 9.5 10*3/uL — ABNORMAL HIGH (ref 1.7–7.7)
Neutrophils Relative %: 77 %
Platelets: 197 10*3/uL (ref 150–400)
RBC: 5.32 MIL/uL (ref 4.22–5.81)
RDW: 13.4 % (ref 11.5–15.5)
WBC: 12.3 10*3/uL — ABNORMAL HIGH (ref 4.0–10.5)

## 2015-12-07 LAB — BASIC METABOLIC PANEL
Anion gap: 11 (ref 5–15)
BUN: 18 mg/dL (ref 6–20)
CO2: 16 mmol/L — ABNORMAL LOW (ref 22–32)
Calcium: 9.4 mg/dL (ref 8.9–10.3)
Chloride: 110 mmol/L (ref 101–111)
Creatinine, Ser: 1.21 mg/dL (ref 0.61–1.24)
GFR calc Af Amer: >60 mL/min (ref >60)
GFR calc non Af Amer: >60 mL/min (ref >60)
Glucose, Bld: 152 mg/dL — ABNORMAL HIGH (ref 65–99)
Potassium: 3.8 mmol/L (ref 3.5–5.1)
Sodium: 137 mmol/L (ref 135–145)

## 2015-12-07 MED ORDER — ONDANSETRON HCL 4 MG/2ML IJ SOLN
4.0000 mg | Freq: Once | INTRAMUSCULAR | Status: AC
  Administered 2015-12-07: 4 mg via INTRAVENOUS
  Filled 2015-12-07: qty 2

## 2015-12-07 MED ORDER — SILODOSIN 4 MG PO CAPS: 4.0000 mg | ORAL_CAPSULE | Freq: Every day | ORAL | 0 refills | Status: AC

## 2015-12-07 MED ORDER — SODIUM CHLORIDE 0.9 % IV BOLUS (SEPSIS)
1000.0000 mL | Freq: Once | INTRAVENOUS | Status: AC
  Administered 2015-12-07: 1000 mL via INTRAVENOUS

## 2015-12-07 MED ORDER — KETOROLAC TROMETHAMINE 15 MG/ML IJ SOLN
15.0000 mg | Freq: Once | INTRAMUSCULAR | Status: AC
  Administered 2015-12-07: 15 mg via INTRAVENOUS
  Filled 2015-12-07: qty 1

## 2015-12-07 MED ORDER — OXYCODONE-ACETAMINOPHEN 5-325 MG PO TABS: 1.0000 | ORAL_TABLET | ORAL | 0 refills | Status: DC | PRN

## 2015-12-07 MED ORDER — ONDANSETRON HCL 4 MG PO TABS
4.0000 mg | ORAL_TABLET | Freq: Four times a day (QID) | ORAL | 0 refills | Status: DC | PRN
Start: 2015-12-07 — End: 2016-07-06

## 2015-12-07 MED ORDER — MORPHINE SULFATE (PF) 4 MG/ML IV SOLN
6.0000 mg | Freq: Once | INTRAVENOUS | Status: AC
  Administered 2015-12-07: 6 mg via INTRAVENOUS
  Filled 2015-12-07: qty 2

## 2015-12-07 NOTE — ED Provider Notes (Signed)
[place] DEPT Provider Note   CSN: [id number] Arrival date & time: 12/07/15  1123   History   Chief Complaint Chief Complaint  Patient presents with  . Nephrolithiasis    HPI Michael Soto is a 55 y.o. male.  The history is provided by the patient.   55 yo with history of nephrolithiasis presenting with flank pain. Onset was today. Located in left flank. Radiating around to the suprapubic area. Severe. Somewhat waxing and waning. Worsening throughout the day. Patient called and scheduled an appt with Urology for this afternoon given history of kidney stones but states he couldn't wait that long because of the severity of pain.  Associated with nausea. Denies vomiting, diarrhea, constipation, blood in stool, fevers. This feels similar to his prior kidney stones. He has had 2 in the past; one required lithotripsy.   Past Medical History:  Diagnosis Date  . Kidney stones     Patient Active Problem List   Diagnosis Date Noted  . 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 External hemorrhoid 06/10/2011    Past Surgical History:  Procedure Laterality Date  . ANTERIOR CRUCIATE LIGAMENT REPAIR         Home Medications    Prior to Admission medications   Medication Sig Start Date End Date Taking? Authorizing Provider  GLUCOSAMINE-CHONDROITIN PO Take 15 mLs by mouth at bedtime.   Yes Historical Provider, MD  Multiple Vitamins-Minerals (MULTIVITAMIN WITH MINERALS) tablet Take 1 tablet by mouth 2 (two) times a week.    Yes Historical Provider, MD  hydrocortisone-pramoxine [facility]) 2.5-1 % rectal cream Place rectally as needed. Hemorrhoids. Patient not taking: Reported on 12/07/2015 08/25/11   Jackolyn Confer, MD  MICONAZOLE NITRATE VAGINAL (MICONAZOLE 3) 4 % CREA Apply 1 application topically daily. Apply daily for 7 days Patient not taking: Reported on 12/07/2015 09/16/11   Jackolyn Confer, MD  nystatin cream (MYCOSTATIN) APPLY TOPICALLY 2 (TWO) TIMES DAILY. Patient not  taking: Reported on 12/07/2015 09/13/11   Jackolyn Confer, MD  ondansetron (ZOFRAN) 4 MG tablet Take 1 tablet (4 mg total) by mouth every 6 (six) hours as needed for nausea or vomiting. 12/07/15   Ivin Booty, MD  oxyCODONE-acetaminophen (PERCOCET/ROXICET) 5-325 MG tablet Take 1-2 tablets by mouth every 4 (four) hours as needed for severe pain. 12/07/15   Ivin Booty, MD  silodosin (RAPAFLO) 4 MG CAPS capsule Take 1 capsule (4 mg total) by mouth daily with breakfast. 12/07/15 12/21/15  Ivin Booty, MD    Family History No family history on file.  Social History Social History  Substance Use Topics  . Smoking status: Never Smoker  . Smokeless tobacco: Never Used  . Alcohol use Yes     Comment: Rarely     Allergies   Review of patient's allergies indicates no known allergies.   Review of Systems Review of Systems  Constitutional: Negative for fever.  Respiratory: Negative for cough and shortness of breath.   Cardiovascular: Negative for chest pain.  Gastrointestinal: Positive for abdominal pain and nausea. Negative for vomiting.  Genitourinary: Positive for flank pain. Negative for dysuria and hematuria.  Musculoskeletal: Positive for back pain.  Skin: Negative for rash.  Allergic/Immunologic: Negative for immunocompromised state.  Hematological: Does not bruise/bleed easily.  All other systems reviewed and are negative.    Physical Exam Updated Vital Signs BP 156/93   Pulse 66   Temp 98.3 F (36.8 C) (Oral)   Resp 20   Ht 5\' 6"  (1.676 m)   Wt  117.9 kg   SpO2 99%   BMI 41.97 kg/m   Physical Exam  Constitutional: He appears well-developed and well-nourished.  Moving around room, appears in pain acutely  HENT:  Head: Normocephalic and atraumatic.  Eyes: Conjunctivae are normal.  Neck: Neck supple.  Cardiovascular: Normal rate and regular rhythm.   No murmur heard. Pulmonary/Chest: Effort normal and breath sounds normal. No respiratory distress. He has no rales.    Abdominal: Soft. There is tenderness. There is no guarding.  L low CVAT, discomfort on suprapubic palpation but minimal compared to percussion of left flank, no right sided CVAT  Musculoskeletal: He exhibits no edema.  Neurological: He is alert.  Skin: Skin is warm and dry.  Psychiatric: He has a normal mood and affect.  Nursing note and vitals reviewed.   ED Treatments / Results  Labs (all labs ordered are listed, but only abnormal results are displayed) Labs Reviewed  CBC WITH DIFFERENTIAL/PLATELET - Abnormal; Notable for the following:       Result Value   WBC 12.3 (*)    Neutro Abs 9.5 (*)    All other components within normal limits  BASIC METABOLIC PANEL - Abnormal; Notable for the following:    CO2 16 (*)    Glucose, Bld 152 (*)    All other components within normal limits    EKG  EKG Interpretation None       Radiology US Renal  Result Date: 12/07/2015 CLINICAL DATA:  Left flank pain since this morning with a history of kidney stones. EXAM: RENAL / URINARY TRACT ULTRASOUND COMPLETE COMPARISON:  CT abdomen pelvis 08/03/2011. FINDINGS: Right Kidney: Length: 13.1 cm. Echogenicity within normal limits. No mass or hydronephrosis visualized. Left Kidney: Length: 13.7 cm. Mild to moderate hydronephrosis. Parenchymal echogenicity is within normal limits. No mass. Bladder: Decompressed. IMPRESSION: Mild to moderate left hydronephrosis. Electronically Signed   By: Lorin Picket M.D.   On: 12/07/2015 12:57    Procedures Procedures (including critical care time)  Medications Ordered in ED Medications  sodium chloride 0.9 % bolus 1,000 mL (0 mLs Intravenous Stopped 12/07/15 1354)  ketorolac (TORADOL) 15 MG/ML injection 15 mg (15 mg Intravenous Given 12/07/15 1218)  ondansetron (ZOFRAN) injection 4 mg (4 mg Intravenous Given 12/07/15 1218)  morphine 4 MG/ML injection 6 mg (6 mg Intravenous Given 12/07/15 1218)     Initial Impression / Assessment and Plan / ED Course  I  have reviewed the triage vital signs and the nursing notes.  Pertinent labs & imaging results that were available during my care of the patient were reviewed by me and considered in my medical decision making (see chart for details).  Clinical Course    55 yo with history of nephrolithiasis presenting with flank pain, nausea as above. AF, VSS other than mild HTN, improved by management of pain. Likely kidney stone.  Renal ultrasound with mild to moderate Hydro on left. Afebrile with mild leukocytosis to 12; unlikely to be infected given acute onset today. Symptoms well controlled. No acute renal impairment. Will try outpatient management with pain medication, antiemetics, follow up with urology. We'll start Rapaflo as this has worked the patient in the past. Discharged in stable condition.  Case discussed with Dr. Maryan Rued, who oversaw management of this patient.   Final Clinical Impressions(s) / ED Diagnoses   Final diagnoses:  Ureteral colic    New Prescriptions New Prescriptions   ONDANSETRON (ZOFRAN) 4 MG TABLET    Take 1 tablet (4 mg total) by mouth  every 6 (six) hours as needed for nausea or vomiting.   OXYCODONE-ACETAMINOPHEN (PERCOCET/ROXICET) 5-325 MG TABLET    Take 1-2 tablets by mouth every 4 (four) hours as needed for severe pain.   SILODOSIN (RAPAFLO) 4 MG CAPS CAPSULE    Take 1 capsule (4 mg total) by mouth daily with breakfast.     Ivin Booty, MD 12/07/15 [place], MD 12/07/15 2031

## 2015-12-07 NOTE — ED Notes (Signed)
Pt walked down hall, pt complained of feeling dizzy.

## 2015-12-07 NOTE — ED Notes (Signed)
Pt asked to provide a urine sample and states he cannot.

## 2016-01-19 ENCOUNTER — Ambulatory Visit: Payer: 59 | Admitting: Family

## 2016-02-11 ENCOUNTER — Ambulatory Visit: Payer: 59 | Admitting: Family Medicine

## 2016-02-15 ENCOUNTER — Ambulatory Visit: Payer: 59 | Admitting: Family Medicine

## 2016-05-18 ENCOUNTER — Other Ambulatory Visit: Payer: Self-pay | Admitting: Gastroenterology

## 2016-06-30 DIAGNOSIS — H25043 Posterior subcapsular polar age-related cataract, bilateral: Secondary | ICD-10-CM | POA: Diagnosis not present

## 2016-06-30 DIAGNOSIS — H2513 Age-related nuclear cataract, bilateral: Secondary | ICD-10-CM | POA: Diagnosis not present

## 2016-07-06 ENCOUNTER — Encounter (HOSPITAL_COMMUNITY): Payer: Self-pay | Admitting: *Deleted

## 2016-07-11 ENCOUNTER — Encounter (HOSPITAL_COMMUNITY)
Admission: RE | Disposition: A | Discharge status: Home / Self Care | Payer: Self-pay | Source: Ambulatory Visit | Attending: Gastroenterology

## 2016-07-11 ENCOUNTER — Ambulatory Visit (HOSPITAL_COMMUNITY): Payer: 59 | Admitting: Anesthesiology

## 2016-07-11 ENCOUNTER — Ambulatory Visit (HOSPITAL_COMMUNITY)
Admission: RE | Admit: 2016-07-11 | Discharge: 2016-07-11 | Disposition: A | Discharge status: Home / Self Care | Payer: 59 | Source: Ambulatory Visit | Attending: Gastroenterology | Admitting: Gastroenterology

## 2016-07-11 ENCOUNTER — Encounter (HOSPITAL_COMMUNITY): Payer: Self-pay | Admitting: *Deleted

## 2016-07-11 DIAGNOSIS — K644 Residual hemorrhoidal skin tags: Secondary | ICD-10-CM | POA: Diagnosis not present

## 2016-07-11 DIAGNOSIS — K635 Polyp of colon: Secondary | ICD-10-CM | POA: Diagnosis not present

## 2016-07-11 DIAGNOSIS — D125 Benign neoplasm of sigmoid colon: Secondary | ICD-10-CM | POA: Diagnosis not present

## 2016-07-11 DIAGNOSIS — Z1211 Encounter for screening for malignant neoplasm of colon: Secondary | ICD-10-CM | POA: Diagnosis not present

## 2016-07-11 DIAGNOSIS — Z6841 Body Mass Index (BMI) 40.0 and over, adult: Secondary | ICD-10-CM | POA: Diagnosis not present

## 2016-07-11 HISTORY — DX: Adverse effect of unspecified anesthetic, initial encounter: T41.45XA

## 2016-07-11 HISTORY — DX: Other complications of anesthesia, initial encounter: T88.59XA

## 2016-07-11 HISTORY — PX: COLONOSCOPY WITH PROPOFOL: SHX5780

## 2016-07-11 HISTORY — DX: Personal history of urinary calculi: Z87.442

## 2016-07-11 SURGERY — COLONOSCOPY WITH PROPOFOL
Anesthesia: Monitor Anesthesia Care

## 2016-07-11 MED ORDER — PROPOFOL 500 MG/50ML IV EMUL
INTRAVENOUS | Status: DC | PRN
  Administered 2016-07-11: 100 ug/kg/min via INTRAVENOUS

## 2016-07-11 MED ORDER — PROPOFOL 10 MG/ML IV BOLUS
INTRAVENOUS | Status: AC
Start: 2016-07-11 — End: 2016-07-11
  Filled 2016-07-11: qty 40

## 2016-07-11 MED ORDER — SODIUM CHLORIDE 0.9 % IV SOLN: INTRAVENOUS | Status: DC

## 2016-07-11 MED ORDER — PROPOFOL 500 MG/50ML IV EMUL
INTRAVENOUS | Status: DC | PRN
  Administered 2016-07-11: 10 mg via INTRAVENOUS
  Administered 2016-07-11: 50 mg via INTRAVENOUS

## 2016-07-11 MED ORDER — LACTATED RINGERS IV SOLN
INTRAVENOUS | Status: DC
  Administered 2016-07-11: 1000 mL via INTRAVENOUS

## 2016-07-11 MED ORDER — PROPOFOL 10 MG/ML IV BOLUS
INTRAVENOUS | Status: AC
  Filled 2016-07-11: qty 20

## 2016-07-11 SURGICAL SUPPLY — 21 items

## 2016-07-11 NOTE — Transfer of Care (Signed)
Immediate Anesthesia Transfer of Care Note  Patient: Michael Soto  Procedure(s) Performed: Procedure(s): COLONOSCOPY WITH PROPOFOL (N/A)  Patient Location: PACU  Anesthesia Type:MAC  Level of Consciousness: awake, alert  and oriented  Airway & Oxygen Therapy: Patient Spontanous Breathing and Patient connected to face mask oxygen  Post-op Assessment: Report given to RN  Post vital signs: Reviewed and stable  Last Vitals:  Vitals:   07/11/16 0821  BP: (!) 171/104  Resp: 19  Temp: 36.7 C    Last Pain:  Vitals:   07/11/16 0821  TempSrc: Oral         Complications: No apparent anesthesia complications

## 2016-07-11 NOTE — H&P (Signed)
Procedure: Baseline screening colonoscopy  History: The patient is a 56 year old male born Dec 04, 1960. He is scheduled to undergo a screening colonoscopy today.  Past medical history: Arthroscopic knee surgery. Lithotripsy to treat kidney stones.  Family history: Negative for colon cancer  Exam: The patient is alert and lying comfortably on the endoscopy stretcher. Abdomen is soft and nontender to palpation. Lungs are clear to auscultation. Cardiac exam reveals a regular rhythm.  Plan: Proceed with screening colonoscopy

## 2016-07-11 NOTE — Op Note (Signed)
[facility] Patient Name: Michael Soto Procedure Date: 07/11/2016 MRN: [id number] Attending MD: Garlan Fair , MD Date of Birth: 1960/10/22 CSN: [id number] Age: 56 Admit Type: Outpatient Procedure:                Colonoscopy Indications:              Screening for colorectal malignant neoplasm Providers:                Garlan Fair, MD, Zenon Mayo, RN, Corliss Parish, Technician, Rosario Adie, CRNA Referring MD:              Medicines:                Propofol per Anesthesia Complications:            No immediate complications. Estimated Blood Loss:     Estimated blood loss was minimal. Procedure:                Pre-Anesthesia Assessment:                           - Prior to the procedure, a History and Physical                            was performed, and patient medications and                            allergies were reviewed. The patient's tolerance of                            previous anesthesia was also reviewed. The risks                            and benefits of the procedure and the sedation                            options and risks were discussed with the patient.                            All questions were answered, and informed consent                            was obtained. Prior Anticoagulants: The patient has                            taken no previous anticoagulant or antiplatelet                            agents. ASA Grade Assessment: II - A patient with                            mild systemic disease. After reviewing the risks  and benefits, the patient was deemed in                            satisfactory condition to undergo the procedure.                           After obtaining informed consent, the colonoscope                            was passed under direct vision. Throughout the                            procedure, the patient's blood pressure, pulse, and                          oxygen saturations were monitored continuously. The                            EC-[id number] ([id number]) scope was introduced through                            the anus and advanced to the the cecum, identified                            by appendiceal orifice and ileocecal valve. The                            colonoscopy was performed without difficulty. The                            patient tolerated the procedure well. The quality                            of the bowel preparation was good. The ileocecal                            valve, the appendiceal orifice and the rectum were                            photographed. Findings:      The perianal and digital rectal examinations were normal.      Two sessile polyps were found in the distal sigmoid colon. The polyps       were 3 mm in size. These polyps were removed with a cold biopsy forceps.       Resection and retrieval were complete.      The exam was otherwise without abnormality. Impression:               - Two 3 mm polyps in the distal sigmoid colon,                            removed with a cold biopsy forceps. Resected and                            retrieved.                           -  The examination was otherwise normal. Moderate Sedation:      N/A- Per Anesthesia Care Recommendation:           - Patient has a contact number available for                            emergencies. The signs and symptoms of potential                            delayed complications were discussed with the                            patient. Return to normal activities tomorrow.                            Written discharge instructions were provided to the                            patient.                           - Repeat colonoscopy date to be determined after                            pending pathology results are reviewed for                            surveillance.                           - Resume previous  diet.                           - Continue present medications. Procedure Code(s):        --- Professional ---                           [id number], Colonoscopy, flexible; with biopsy, single                            or multiple Diagnosis Code(s):        --- Professional ---                           Z12.11, Encounter for screening for malignant                            neoplasm of colon                           D12.5, Benign neoplasm of sigmoid colon CPT copyright 2016 American Medical Association. All rights reserved. The codes documented in this report are preliminary and upon coder review may  be revised to meet current compliance requirements. Earle Gell, MD Garlan Fair, MD 07/11/2016 10:09:53 AM This report has been signed electronically. Number of Addenda: 0

## 2016-07-11 NOTE — Anesthesia Postprocedure Evaluation (Signed)
Anesthesia Post Note  Patient: Michael Soto  Procedure(s) Performed: Procedure(s) (LRB): COLONOSCOPY WITH PROPOFOL (N/A)  Patient location during evaluation: Endoscopy Anesthesia Type: MAC Level of consciousness: awake and alert Pain management: pain level controlled Vital Signs Assessment: post-procedure vital signs reviewed and stable Respiratory status: spontaneous breathing, nonlabored ventilation, respiratory function stable and patient connected to nasal cannula oxygen Cardiovascular status: stable and blood pressure returned to baseline Anesthetic complications: no       Last Vitals:  Vitals:   07/11/16 1030 07/11/16 1040  BP: (!) 157/89 (!) 167/99  Pulse: 65 66  Resp: 15 13  Temp:      Last Pain:  Vitals:   07/11/16 1012  TempSrc: Oral                 Catalina Gravel

## 2016-07-11 NOTE — Anesthesia Preprocedure Evaluation (Addendum)
Anesthesia Evaluation  Patient identified by MRN, date of birth, ID band Patient awake    Reviewed: Allergy & Precautions, NPO status , Patient's Chart, lab work & pertinent test results  Airway Mallampati: II  TM Distance: >3 FB Neck ROM: Full    Dental  (+) Teeth Intact, Dental Advisory Given   Pulmonary neg pulmonary ROS,    Pulmonary exam normal breath sounds clear to auscultation       Cardiovascular Exercise Tolerance: Good negative cardio ROS Normal cardiovascular exam Rhythm:Regular Rate:Normal     Neuro/Psych negative neurological ROS  negative psych ROS   GI/Hepatic negative GI ROS, Neg liver ROS,   Endo/Other  Morbid obesity  Renal/GU negative Renal ROS     Musculoskeletal negative musculoskeletal ROS (+)   Abdominal   Peds  Hematology negative hematology ROS (+)   Anesthesia Other Findings Day of surgery medications reviewed with the patient.  Reproductive/Obstetrics                             Anesthesia Physical Anesthesia Plan  ASA: III  Anesthesia Plan: MAC   Post-op Pain Management:    Induction: Intravenous  Airway Management Planned: Nasal Cannula  Additional Equipment:   Intra-op Plan:   Post-operative Plan:   Informed Consent: I have reviewed the patients History and Physical, chart, labs and discussed the procedure including the risks, benefits and alternatives for the proposed anesthesia with the patient or authorized representative who has indicated his/her understanding and acceptance.   Dental advisory given  Plan Discussed with: CRNA and Anesthesiologist  Anesthesia Plan Comments: (Discussed risks/benefits/alternatives to MAC sedation including need for ventilatory support, hypotension, need for conversion to general anesthesia.  All patient questions answered.  Patient/guardian wishes to proceed.)        Anesthesia Quick Evaluation

## 2016-07-11 NOTE — Discharge Instructions (Signed)

## 2016-07-13 ENCOUNTER — Encounter (HOSPITAL_COMMUNITY): Payer: Self-pay | Admitting: Gastroenterology

## 2016-08-30 DIAGNOSIS — H2513 Age-related nuclear cataract, bilateral: Secondary | ICD-10-CM | POA: Diagnosis not present

## 2016-08-30 DIAGNOSIS — H2511 Age-related nuclear cataract, right eye: Secondary | ICD-10-CM | POA: Diagnosis not present

## 2016-10-17 DIAGNOSIS — H2511 Age-related nuclear cataract, right eye: Secondary | ICD-10-CM | POA: Diagnosis not present

## 2016-11-17 DIAGNOSIS — H16223 Keratoconjunctivitis sicca, not specified as Sjogren's, bilateral: Secondary | ICD-10-CM | POA: Diagnosis not present

## 2017-02-08 DIAGNOSIS — B029 Zoster without complications: Secondary | ICD-10-CM | POA: Diagnosis not present

## 2017-02-13 DIAGNOSIS — R21 Rash and other nonspecific skin eruption: Secondary | ICD-10-CM | POA: Diagnosis not present

## 2017-02-13 DIAGNOSIS — R030 Elevated blood-pressure reading, without diagnosis of hypertension: Secondary | ICD-10-CM | POA: Diagnosis not present

## 2017-02-13 DIAGNOSIS — E7800 Pure hypercholesterolemia, unspecified: Secondary | ICD-10-CM | POA: Diagnosis not present

## 2017-02-13 DIAGNOSIS — R7301 Impaired fasting glucose: Secondary | ICD-10-CM | POA: Diagnosis not present

## 2017-02-28 ENCOUNTER — Encounter (HOSPITAL_COMMUNITY): Payer: Self-pay | Admitting: *Deleted

## 2017-02-28 ENCOUNTER — Other Ambulatory Visit: Payer: Self-pay

## 2017-02-28 ENCOUNTER — Emergency Department (HOSPITAL_COMMUNITY)
Admission: EM | Admit: 2017-02-28 | Discharge: 2017-02-28 | Disposition: A | Discharge status: Home / Self Care | Payer: 59 | Attending: Emergency Medicine | Admitting: Emergency Medicine

## 2017-02-28 ENCOUNTER — Encounter (HOSPITAL_COMMUNITY): Payer: Self-pay

## 2017-02-28 ENCOUNTER — Emergency Department (HOSPITAL_COMMUNITY)
Admission: EM | Admit: 2017-02-28 | Discharge: 2017-02-28 | Disposition: A | Discharge status: Home / Self Care | Payer: 59 | Source: Home / Self Care

## 2017-02-28 ENCOUNTER — Emergency Department (HOSPITAL_COMMUNITY): Payer: 59

## 2017-02-28 DIAGNOSIS — R0789 Other chest pain: Secondary | ICD-10-CM | POA: Diagnosis not present

## 2017-02-28 DIAGNOSIS — Z7982 Long term (current) use of aspirin: Secondary | ICD-10-CM | POA: Diagnosis not present

## 2017-02-28 DIAGNOSIS — I10 Essential (primary) hypertension: Secondary | ICD-10-CM | POA: Diagnosis not present

## 2017-02-28 DIAGNOSIS — R030 Elevated blood-pressure reading, without diagnosis of hypertension: Secondary | ICD-10-CM | POA: Insufficient documentation

## 2017-02-28 DIAGNOSIS — Z79899 Other long term (current) drug therapy: Secondary | ICD-10-CM | POA: Diagnosis not present

## 2017-02-28 DIAGNOSIS — Z5321 Procedure and treatment not carried out due to patient leaving prior to being seen by health care provider: Secondary | ICD-10-CM | POA: Insufficient documentation

## 2017-02-28 DIAGNOSIS — R079 Chest pain, unspecified: Secondary | ICD-10-CM | POA: Diagnosis present

## 2017-02-28 LAB — CBC
HCT: 46.7 % (ref 39.0–52.0)
Hemoglobin: 16.2 g/dL (ref 13.0–17.0)
MCH: 31 pg (ref 26.0–34.0)
MCHC: 34.7 g/dL (ref 30.0–36.0)
MCV: 89.3 fL (ref 78.0–100.0)
Platelets: 207 10*3/uL (ref 150–400)
RBC: 5.23 MIL/uL (ref 4.22–5.81)
RDW: 13.8 % (ref 11.5–15.5)
WBC: 7.6 10*3/uL (ref 4.0–10.5)

## 2017-02-28 LAB — I-STAT TROPONIN, ED
Troponin i, poc: 0 ng/mL (ref 0.00–0.08)
Troponin i, poc: 0 ng/mL (ref 0.00–0.08)

## 2017-02-28 LAB — BASIC METABOLIC PANEL
Anion gap: 9 (ref 5–15)
BUN: 10 mg/dL (ref 6–20)
CO2: 26 mmol/L (ref 22–32)
Calcium: 9.5 mg/dL (ref 8.9–10.3)
Chloride: 103 mmol/L (ref 101–111)
Creatinine, Ser: 0.93 mg/dL (ref 0.61–1.24)
GFR calc Af Amer: >60 mL/min (ref >60)
GFR calc non Af Amer: >60 mL/min (ref >60)
Glucose, Bld: 123 mg/dL — ABNORMAL HIGH (ref 65–99)
Potassium: 4.1 mmol/L (ref 3.5–5.1)
Sodium: 138 mmol/L (ref 135–145)

## 2017-02-28 NOTE — ED Triage Notes (Addendum)
Pt. Reports having elevated BP in the last few days and reports having chest muscle pain, center of his chest radiated around to under his rt. Arm pit.  Pt. Denies any injuries.  denies any sob, n/v/d.  Pt. Denies having any hx pf HTN and has been under alot of stress .  Pt. Denies any cold symptoms , cough or fever , chills  Skin is warm, dry and pink.  Pt. Is alert and oriented X 4.

## 2017-02-28 NOTE — ED Triage Notes (Signed)
States he normally takes his blood pressure everyday and his pressure was elevated today concerned.

## 2017-02-28 NOTE — ED Notes (Signed)
Attempted to call pt X2 in triage no response, Saralyn Pilar NT also attempted to call

## 2017-02-28 NOTE — ED Provider Notes (Signed)
[facility] EMERGENCY DEPARTMENT Provider Note  CSN: [id number] Arrival date & time: 02/28/17 1346  Chief Complaint(s) Chest Pain and Hypertension  HPI Michael Soto is a 56 y.o. male   The history is provided by the patient.  Chest Pain   This is a new problem. The current episode started 3 to 5 hours ago. The problem occurs constantly. The problem has been resolved. The pain is associated with rest. The pain is present in the lateral region (left). The pain is mild. The quality of the pain is described as sharp. The pain does not radiate. The symptoms are aggravated by certain positions (and palpation of the left chest). Associated symptoms include lower extremity edema. Pertinent negatives include no cough, no diaphoresis, no fever, no headaches, no hemoptysis, no leg pain, no nausea, no palpitations, no shortness of breath and no vomiting. He has tried nothing for the symptoms.  His past medical history is significant for hyperlipidemia and hypertension.  Pertinent negatives for past medical history include no CAD, no diabetes, no DVT, no MI and no PE.  Procedure history is negative for exercise treadmill test.  Hypertension  Associated symptoms include chest pain. Pertinent negatives include no headaches and no shortness of breath.   No prior h/o HTN. States he's been under a lot of stress recently due to his mother having a recent MI.  Past Medical History Past Medical History:  Diagnosis Date  . Complication of anesthesia    slow to move knees after surgery  . History of kidney stones 2013   at least 2   Patient Active Problem List   Diagnosis Date Noted  . 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 External hemorrhoid 06/10/2011   Home Medication(s) Prior to Admission medications   Medication Sig Start Date End Date Taking? Authorizing Provider  acetaminophen (TYLENOL) 325 MG tablet Take 325-650 mg by mouth every 6 (six) hours as needed (for pain or  headaches).   Yes [provider]  aspirin EC 325 MG tablet Take 325 mg by mouth daily.   Yes [provider]  LUTEIN PO Take 1 tablet by mouth daily.   Yes [provider]  Multiple Vitamins-Minerals (ONE-A-DAY MENS 50+ ADVANTAGE) TABS Take 1 tablet by mouth every other day.   Yes [provider]                                                                                                                                    Past Surgical History Past Surgical History:  Procedure Laterality Date  . ANTERIOR CRUCIATE LIGAMENT REPAIR Left 20 yrs ago   ligament was gone had surgery meniscus was removed  . CATARACT EXTRACTION    . COLONOSCOPY WITH PROPOFOL N/A 07/11/2016   Procedure: COLONOSCOPY WITH PROPOFOL;  Surgeon: Garlan Fair, MD;  Location: [facility] ENDOSCOPY;  Service: Endoscopy;  Laterality: N/A;  . ewsl  2013  .  KNEE ARTHROSCOPY     Family History No family history on file.  Social History Social History   Tobacco Use  . Smoking status: Never Smoker  . Smokeless tobacco: Never Used  Substance Use Topics  . Alcohol use: No    Frequency: Never    Comment: Rarely  . Drug use: No   Allergies Patient has no known allergies.  Review of Systems Review of Systems  Constitutional: Negative for diaphoresis and fever.  Respiratory: Negative for cough, hemoptysis and shortness of breath.   Cardiovascular: Positive for chest pain. Negative for palpitations.  Gastrointestinal: Negative for nausea and vomiting.  Neurological: Negative for headaches.   All other systems are reviewed and are negative for acute change except as noted in the HPI  Physical Exam Vital Signs  I have reviewed the triage vital signs BP 140/89   Pulse 71   Temp 98.4 F (36.9 C) (Oral)   Resp 18   Ht 5\' 6"  (1.676 m)   Wt 117.9 kg (260 lb)   SpO2 100%   BMI 41.97 kg/m   Physical Exam  Constitutional: He is oriented to person, place, and time. He appears  well-developed and well-nourished. No distress.  HENT:  Head: Normocephalic and atraumatic.  Nose: Nose normal.  Eyes: Conjunctivae and EOM are normal. Pupils are equal, round, and reactive to light. Right eye exhibits no discharge. Left eye exhibits no discharge. No scleral icterus.  Neck: Normal range of motion. Neck supple.  Cardiovascular: Normal rate and regular rhythm. Exam reveals no gallop and no friction rub.  No murmur heard. Pulmonary/Chest: Effort normal and breath sounds normal. No stridor. No respiratory distress. He has no rales. He exhibits tenderness.    Abdominal: Soft. He exhibits no distension. There is no tenderness.  Musculoskeletal: He exhibits no edema or tenderness.  Neurological: He is alert and oriented to person, place, and time.  Skin: Skin is warm and dry. No rash noted. He is not diaphoretic. No erythema.  Psychiatric: He has a normal mood and affect.  Vitals reviewed.   ED Results and Treatments Labs (all labs ordered are listed, but only abnormal results are displayed) Labs Reviewed  BASIC METABOLIC PANEL - Abnormal; Notable for the following components:      Result Value   Glucose, Bld 123 (*)    All other components within normal limits  CBC  I-STAT TROPONIN, ED  I-STAT TROPONIN, ED                                                                                                                         EKG  EKG Interpretation  Date/Time:  Tuesday February 28 2017 14:20:29 EST Ventricular Rate:  80 PR Interval:  142 QRS Duration: 84 QT Interval:  382 QTC Calculation: 440 R Axis:   45 Text Interpretation:  Normal sinus rhythm Normal ECG No old tracing to compare Reconfirmed by Addison Lank [phone]) on 02/28/2017 5:36:57 PM      Radiology  Dg Chest 2 View  Result Date: 02/28/2017 CLINICAL DATA:  Hypertension EXAM: CHEST  2 VIEW COMPARISON:  None. FINDINGS: There is no edema or consolidation. The heart size and pulmonary vascularity are  normal. No pneumothorax. No adenopathy. No evident bone lesions. IMPRESSION: No edema or consolidation. Electronically Signed   By: Lowella Grip III M.D.   On: 02/28/2017 15:06   Pertinent labs & imaging results that were available during my care of the patient were reviewed by me and considered in my medical decision making (see chart for details).  Medications Ordered in ED Medications - No data to display                                                                                                                                  Procedures Procedures  (including critical care time)  Medical Decision Making / ED Course I have reviewed the nursing notes for this encounter and the patient's prior records (if available in EHR or on provided paperwork).  Clinical Course as of Mar 01 1903  Tue Feb 28, 2017  1810 Atypical chest pain highly inconsistent with ACS.  EKG without acute ischemic changes or evidence of pericarditis.  Initial troponin negative.  Heart score less than 4.  Feel he is appropriate for delta troponin.  Chest x-ray without evidence suggestive of pneumonia, pneumothorax, pneumomediastinum.  No abnormal contour of the mediastinum to suggest dissection. No evidence of acute injuries.  Presentation not classic for aortic dissection or esophageal perforation  Low pretest probability for pulmonary embolism.  Labs without evidence of endorgan damage.  Blood pressure improved without intervention.  Patient already has follow-up with his primary care provider in the morning.  We will await the results of the delta troponin.   [PC]  1903 Delta troponin negative.  The patient is safe for discharge with strict return precautions.   [PC]    Clinical Course User Index [PC] Kashmir Leedy, Grayce Sessions, MD     Final Clinical Impression(s) / ED Diagnoses Final diagnoses:  Atypical chest pain  Hypertension, unspecified type    Disposition: Discharge  Condition:  Good  I have discussed the results, Dx and Tx plan with the patient who expressed understanding and agree(s) with the plan. Discharge instructions discussed at great length. The patient was given strict return precautions who verbalized understanding of the instructions. No further questions at time of discharge.    ED Discharge Orders    None       Follow Up: Seward Carol, MD [place] [address] [place] [address] [phone]   As scheduled for tomorrow     This chart was dictated using voice recognition software.  Despite best efforts to proofread,  errors can occur which can change the documentation meaning.   Fatima Blank, MD 02/28/17 [phone]

## 2017-03-01 DIAGNOSIS — E7800 Pure hypercholesterolemia, unspecified: Secondary | ICD-10-CM | POA: Diagnosis not present

## 2017-03-01 DIAGNOSIS — R7303 Prediabetes: Secondary | ICD-10-CM | POA: Diagnosis not present

## 2017-03-09 DIAGNOSIS — R030 Elevated blood-pressure reading, without diagnosis of hypertension: Secondary | ICD-10-CM | POA: Diagnosis not present

## 2017-03-28 DIAGNOSIS — E7800 Pure hypercholesterolemia, unspecified: Secondary | ICD-10-CM | POA: Insufficient documentation

## 2017-03-28 DIAGNOSIS — I10 Essential (primary) hypertension: Secondary | ICD-10-CM

## 2017-03-28 DIAGNOSIS — E663 Overweight: Secondary | ICD-10-CM | POA: Insufficient documentation

## 2017-03-28 DIAGNOSIS — R0789 Other chest pain: Secondary | ICD-10-CM

## 2017-03-28 DIAGNOSIS — A084 Viral intestinal infection, unspecified: Secondary | ICD-10-CM | POA: Insufficient documentation

## 2017-03-28 DIAGNOSIS — N200 Calculus of kidney: Secondary | ICD-10-CM | POA: Insufficient documentation

## 2017-03-28 HISTORY — DX: Other chest pain: R07.89

## 2017-03-28 HISTORY — DX: Essential (primary) hypertension: I10

## 2017-03-28 NOTE — Progress Notes (Signed)
Cardiology Office Note    Date:  03/29/2017   ID:  Michael Soto, DOB [birth year], MRN [id number]  PCP:  Seward Carol, MD  Cardiologist: Sinclair Grooms, MD   Chief Complaint  Patient presents with  . Chest Pain  . Follow-up    Hypertension    History of Present Illness:  Michael Soto is a 56 y.o. male referred by Addison Lank MD and Seward Carol MD for evaluation of hypertension and chest discomfort.  Toxicology technician at [employer]  He has no history of vascular disease but has significant family history of premature atherosclerosis and CAD that includes his mother who recently had trouble bypass surgery.  2 maternal uncles and aunts with history of bypass surgery, a history of stroke and a maternal cousin maternal grand father with history of CVA.  He went to the emergency room at [facility] 02/28/2017 because of left axillary discomfort.  EKG and blood work were normal.  He is referred by primary physician for an opinion concerning his overall cardiovascular health.  Patient himself is looking to be proactive in an effort to prevent development of CAD/CVA/etc.  He is nondiabetic, does not smoke, does not drink.  Past Medical History:  Diagnosis Date  . 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 Chest discomfort 03/28/2017  . Complication of anesthesia    slow to move knees after surgery  . Essential hypertension 03/28/2017  . External hemorrhoid 06/10/2011  . History of kidney stones 2013   at least 2  . Male genital ulcer 08/25/2011  . Prediabetes   . Pure hypercholesterolemia     Past Surgical History:  Procedure Laterality Date  . ANTERIOR CRUCIATE LIGAMENT REPAIR Left 20 yrs ago   ligament was gone had surgery meniscus was removed  . CATARACT EXTRACTION    . COLONOSCOPY WITH PROPOFOL N/A 07/11/2016   Procedure: COLONOSCOPY WITH PROPOFOL;  Surgeon: Garlan Fair, MD;  Location: [facility] ENDOSCOPY;  Service: Endoscopy;  Laterality: N/A;  . ewsl  2013  . KNEE ARTHROSCOPY       Current Medications: Outpatient Medications Prior to Visit  Medication Sig Dispense Refill  . acetaminophen (TYLENOL) 325 MG tablet Take 325-650 mg by mouth every 6 (six) hours as needed (for pain or headaches).    . LUTEIN PO Take 1 tablet by mouth daily.    . Multiple Vitamins-Minerals (ONE-A-DAY MENS 50+ ADVANTAGE) TABS Take 1 tablet by mouth every other day.    Marland Kitchen aspirin EC 325 MG tablet Take 325 mg by mouth daily.    . hydrochlorothiazide (HYDRODIURIL) 12.5 MG tablet Take 12.5 mg by mouth every morning.  0  . valACYclovir (VALTREX) 500 MG tablet Take 500 mg by mouth 2 (two) times daily.     No facility-administered medications prior to visit.      Allergies:   Patient has no known allergies.   Social History   Socioeconomic History  . Marital status: Single    Spouse name: None  . Number of children: 0  . Years of education: None  . Highest education level: None  Social Needs  . Financial resource strain: None  . Food insecurity - worry: None  . Food insecurity - inability: None  . Transportation needs - medical: None  . Transportation needs - non-medical: None  Occupational History  . 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 Smoking status: Never Smoker  . Smokeless tobacco: Never Used  Substance and Sexual Activity  .  Alcohol use: No    Frequency: Never    Comment: Rarely  . Drug use: No  . Sexual activity: None  Other Topics Concern  . None  Social History Narrative  . None     Family History:  The patient's family history includes Diabetes in his father; Other in his mother.   ROS:   Please see the history of present illness.    Snores according to family.  He has slight anxiety.  Does not sleep much at night because he is responsible for the health of his mother and father elderly. All other systems reviewed and are negative.   PHYSICAL EXAM:   VS:  BP (!) 150/98   Pulse 76   Ht 5\' 6"  (1.676 m)   Wt 256 lb 12.8 oz (116.5  kg)   BMI 41.45 kg/m    GEN: Well nourished, well developed, in no acute distress  HEENT: normal  Neck: no JVD, carotid bruits, or masses Cardiac: RRR; no murmurs, rubs, or gallops,no edema  Respiratory:  clear to auscultation bilaterally, normal work of breathing GI: soft, nontender, nondistended, + BS MS: no deformity or atrophy  Skin: warm and dry, no rash Neuro:  Alert and Oriented x 3, Strength and sensation are intact Psych: euthymic mood, full affect  Wt Readings from Last 3 Encounters:  03/29/17 256 lb 12.8 oz (116.5 kg)  02/28/17 260 lb (117.9 kg)  02/28/17 260 lb (117.9 kg)      Studies/Labs Reviewed:   EKG:  EKG from 02/28/17, left atrial abnormality, otherwise unremarkable.  Recent Labs: 02/28/2017: BUN 10; Creatinine, Ser 0.93; Hemoglobin 16.2; Platelets 207; Potassium 4.1; Sodium 138   Lipid Panel No results found for: CHOL, TRIG, HDL, CHOLHDL, VLDL, LDLCALC, LDLDIRECT  Additional studies/ records that were reviewed today include:  LDL cholesterol 02/13/17 is 152, HDL 31, total cholesterol 222.  Globin A1c 5.8 [facility])    ASSESSMENT:    1. Chest discomfort   2. Essential hypertension   3. Hyperlipidemia with target LDL less than 100   4. Morbid obesity ([place])   5. Family history of premature CAD      PLAN:  In order of problems listed above:  1. Atypical discomfort not likely to represent ischemia.  I am recommending an exercise treadmill test blood pressures under better control to establish a baseline for future reference.  This is particularly important given his multitude of risk factors as noted above (family history, elevated lipids, control hypertension, obesity, borderline globin A1c). 2. 130/85 mmHg or less.  Add losartan 50 mg/day therefore convert to Hyzaar 50/12.5 mg. 3. LDL cholesterol should at minimum be less than 100 preferably less than 70.  Start atorvastatin 20 mg/day.  Liver and lipid panel 6 weeks from now at time of  exercise treadmill test to establish control. 4. Aerobic activity, decrease carbohydrates in diet, increased plant-based components   Clinical follow-up in 4 months.  Exercise treadmill test once blood pressure control.  Since if I antihypertensive regimen.  Coordinate care with Dr. Delfina Redwood.  I have asked the patient to discuss my recommendations with Dr. Delfina Redwood tomorrow on a yearly exam for making the changes that I have recommended.    Medication Adjustments/Labs and Tests Ordered: Current medicines are reviewed at length with the patient today.  Concerns regarding medicines are outlined above.  Medication changes, Labs and Tests ordered today are listed in the Patient Instructions below. There are no Patient Instructions on file for this visit.  Signed, Sinclair Grooms, MD  03/29/2017 9:46 AM    [facility] [place], [place], [place]  [address] Phone: [phone]; Fax: [phone]

## 2017-03-29 ENCOUNTER — Encounter: Payer: Self-pay | Admitting: Interventional Cardiology

## 2017-03-29 ENCOUNTER — Ambulatory Visit: Payer: 59 | Admitting: Interventional Cardiology

## 2017-03-29 VITALS — BP 150/98 | HR 76 | Ht 66.0 in | Wt 256.8 lb

## 2017-03-29 DIAGNOSIS — E785 Hyperlipidemia, unspecified: Secondary | ICD-10-CM | POA: Diagnosis not present

## 2017-03-29 DIAGNOSIS — I10 Essential (primary) hypertension: Secondary | ICD-10-CM | POA: Diagnosis not present

## 2017-03-29 DIAGNOSIS — R0789 Other chest pain: Secondary | ICD-10-CM | POA: Diagnosis not present

## 2017-03-29 DIAGNOSIS — Z8249 Family history of ischemic heart disease and other diseases of the circulatory system: Secondary | ICD-10-CM

## 2017-03-29 MED ORDER — ASPIRIN EC 81 MG PO TBEC: 81.0000 mg | DELAYED_RELEASE_TABLET | Freq: Every day | ORAL | 3 refills | Status: DC

## 2017-03-29 MED ORDER — ATORVASTATIN CALCIUM 20 MG PO TABS: 20.0000 mg | ORAL_TABLET | Freq: Every day | ORAL | 3 refills | Status: DC

## 2017-03-29 MED ORDER — LOSARTAN POTASSIUM-HCTZ 50-12.5 MG PO TABS: 1.0000 | ORAL_TABLET | Freq: Every day | ORAL | 3 refills | Status: DC

## 2017-03-29 NOTE — Patient Instructions (Signed)
Medication Instructions:  1) DISCONTINUE plain Hydrochlorothiazide 2) START Losartan/HCTZ 50/12.5mg  once daily 3) START Atorvastatin 20mg  once daily 4) DECREASE Aspirin to 81mg  once daily.  Labwork: Your physician recommends that you return for lab work in: 6 weeks (Lipid, liver).  Can be done same day as GXT as long as it is around the 6 week mark.   Testing/Procedures: Your physician has requested that you have an exercise tolerance test after the new year. For further information please visit [email or web address]. Please also follow instruction sheet, as given.    Follow-Up: Your physician recommends that you schedule a follow-up appointment in: 4 months with Dr. Tamala Julian.   Any Other Special Instructions Will Be Listed Below (If Applicable).  Your physician recommends that you exercise 30 minutes a day, at least 3 times a week.    LDL (bad) cholesterol target is less than 70.  Please follow a low carbohydrate diet.    If you need a refill on your cardiac medications before your next appointment, please call your pharmacy.

## 2017-03-30 DIAGNOSIS — Z125 Encounter for screening for malignant neoplasm of prostate: Secondary | ICD-10-CM | POA: Diagnosis not present

## 2017-03-30 DIAGNOSIS — Z0000 Encounter for general adult medical examination without abnormal findings: Secondary | ICD-10-CM | POA: Diagnosis not present

## 2017-05-10 ENCOUNTER — Other Ambulatory Visit: Payer: 59 | Admitting: *Deleted

## 2017-05-10 ENCOUNTER — Ambulatory Visit (INDEPENDENT_AMBULATORY_CARE_PROVIDER_SITE_OTHER): Payer: 59

## 2017-05-10 DIAGNOSIS — R0789 Other chest pain: Secondary | ICD-10-CM | POA: Diagnosis not present

## 2017-05-10 DIAGNOSIS — I10 Essential (primary) hypertension: Secondary | ICD-10-CM | POA: Diagnosis not present

## 2017-05-10 LAB — LIPID PANEL
Chol/HDL Ratio: 4.1 ratio (ref 0.0–5.0)
Cholesterol, Total: 144 mg/dL (ref 100–199)
HDL: 35 mg/dL — ABNORMAL LOW (ref >39)
LDL Calculated: 93 mg/dL (ref 0–99)
Triglycerides: 81 mg/dL (ref 0–149)
VLDL Cholesterol Cal: 16 mg/dL (ref 5–40)

## 2017-05-10 LAB — HEPATIC FUNCTION PANEL
ALT: 46 IU/L — ABNORMAL HIGH (ref 0–44)
AST: 40 IU/L (ref 0–40)
Albumin: 4.4 g/dL (ref 3.5–5.5)
Alkaline Phosphatase: 69 IU/L (ref 39–117)
Bilirubin Total: 0.7 mg/dL (ref 0.0–1.2)
Bilirubin, Direct: 0.2 mg/dL (ref 0.00–0.40)
Total Protein: 7.3 g/dL (ref 6.0–8.5)

## 2017-05-10 LAB — EXERCISE TOLERANCE TEST
Estimated workload: 8.2 METS
Exercise duration (min): 7 min
Exercise duration (sec): 22 s
MPHR: 164 {beats}/min
Peak HR: 150 {beats}/min
Percent HR: 91 %
RPE: 16
Rest HR: 73 {beats}/min

## 2017-05-30 DIAGNOSIS — H25012 Cortical age-related cataract, left eye: Secondary | ICD-10-CM | POA: Diagnosis not present

## 2017-05-30 DIAGNOSIS — H2512 Age-related nuclear cataract, left eye: Secondary | ICD-10-CM | POA: Diagnosis not present

## 2017-05-30 DIAGNOSIS — Z961 Presence of intraocular lens: Secondary | ICD-10-CM | POA: Diagnosis not present

## 2017-09-28 DIAGNOSIS — I10 Essential (primary) hypertension: Secondary | ICD-10-CM | POA: Diagnosis not present

## 2017-09-28 DIAGNOSIS — R7301 Impaired fasting glucose: Secondary | ICD-10-CM | POA: Diagnosis not present

## 2017-09-28 DIAGNOSIS — E7800 Pure hypercholesterolemia, unspecified: Secondary | ICD-10-CM | POA: Diagnosis not present

## 2017-10-30 DIAGNOSIS — I10 Essential (primary) hypertension: Secondary | ICD-10-CM | POA: Diagnosis not present

## 2017-10-30 DIAGNOSIS — M542 Cervicalgia: Secondary | ICD-10-CM | POA: Diagnosis not present

## 2017-10-30 DIAGNOSIS — E7800 Pure hypercholesterolemia, unspecified: Secondary | ICD-10-CM | POA: Diagnosis not present

## 2018-05-01 DIAGNOSIS — Z23 Encounter for immunization: Secondary | ICD-10-CM | POA: Diagnosis not present

## 2018-05-01 DIAGNOSIS — E7800 Pure hypercholesterolemia, unspecified: Secondary | ICD-10-CM | POA: Diagnosis not present

## 2018-05-01 DIAGNOSIS — Z0000 Encounter for general adult medical examination without abnormal findings: Secondary | ICD-10-CM | POA: Diagnosis not present

## 2018-05-01 DIAGNOSIS — I10 Essential (primary) hypertension: Secondary | ICD-10-CM | POA: Diagnosis not present

## 2018-05-01 DIAGNOSIS — Z125 Encounter for screening for malignant neoplasm of prostate: Secondary | ICD-10-CM | POA: Diagnosis not present

## 2018-05-28 DIAGNOSIS — R7301 Impaired fasting glucose: Secondary | ICD-10-CM | POA: Diagnosis not present

## 2018-05-28 DIAGNOSIS — R748 Abnormal levels of other serum enzymes: Secondary | ICD-10-CM | POA: Diagnosis not present

## 2018-06-27 DIAGNOSIS — R197 Diarrhea, unspecified: Secondary | ICD-10-CM | POA: Diagnosis not present

## 2019-09-18 ENCOUNTER — Encounter: Payer: Self-pay | Admitting: Interventional Cardiology

## 2019-11-28 NOTE — Progress Notes (Signed)
Cardiology Office Note:    Date:  11/29/2019   ID:  Michael Soto, DOB 02-Jan-1961, MRN [id number]  PCP:  Seward Carol, MD  Cardiologist:  Sinclair Grooms, MD   Referring MD: Seward Carol, MD   Chief Complaint  Patient presents with  . Hypertension  . Hyperlipidemia    History of Present Illness:    Michael Soto is a 59 y.o. male with a hx of hypertension, chest pain with low risk myocardial perfusion study 2019, obesity, and hyperlipidemia.  In the interval since the last visit, he has not had chest discomfort, dyspnea, or cardiac or other vascular event.  He has been unable to control his weight.  He denies orthopnea, PND, and lower extremity swelling.  No palpitations or syncope.  Not exercising on a regular basis.  Relatively sedentary.  Past Medical History:  Diagnosis Date  . 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 Chest discomfort 03/28/2017  . Complication of anesthesia    slow to move knees after surgery  . Essential hypertension 03/28/2017  . External hemorrhoid 06/10/2011  . History of kidney stones 2013   at least 2  . Male genital ulcer 08/25/2011  . Prediabetes   . Pure hypercholesterolemia     Past Surgical History:  Procedure Laterality Date  . ANTERIOR CRUCIATE LIGAMENT REPAIR Left 20 yrs ago   ligament was gone had surgery meniscus was removed  . CATARACT EXTRACTION    . COLONOSCOPY WITH PROPOFOL N/A 07/11/2016   Procedure: COLONOSCOPY WITH PROPOFOL;  Surgeon: Garlan Fair, MD;  Location: [facility];  Service: Endoscopy;  Laterality: N/A;  . ewsl  2013  . KNEE ARTHROSCOPY      Current Medications: Current Meds  Medication Sig  . acetaminophen (TYLENOL) 325 MG tablet Take 325-650 mg by mouth every 6 (six) hours as needed (for pain or headaches).  Marland Kitchen aspirin EC 81 MG tablet Take 1 tablet (81 mg total) by mouth daily.  Marland Kitchen losartan-hydrochlorothiazide (HYZAAR) 50-12.5 MG tablet Take 1 tablet by mouth daily.  . LUTEIN PO Take 1 tablet by mouth daily.    . metFORMIN (GLUCOPHAGE) 500 MG tablet Take 500 mg by mouth daily.  . Multiple Vitamins-Minerals (ONE-A-DAY MENS 50+ ADVANTAGE) TABS Take 1 tablet by mouth every other day.     Allergies:   Patient has no known allergies.   Social History   Socioeconomic History  . Marital status: Single    Spouse name: Not on file  . Number of children: 0  . Years of education: Not on file  . Highest education level: Not on file  Occupational History  . 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 Smoking status: Never Smoker  . Smokeless tobacco: Never Used  Substance and Sexual Activity  . Alcohol use: No    Comment: Rarely  . Drug use: No  . Sexual activity: Not on file  Other Topics Concern  . Not on file  Social History Narrative  . Not on file   Social Determinants of Health   Financial Resource Strain:   . Difficulty of Paying Living Expenses: Not on file  Food Insecurity:   . Worried About Charity fundraiser in the Last Year: Not on file  . Ran Out of Food in the Last Year: Not on file  Transportation Needs:   . Lack of Transportation (Medical): Not on file  . Lack of Transportation (Non-Medical): Not on file  Physical Activity:   .  Days of Exercise per Week: Not on file  . Minutes of Exercise per Session: Not on file  Stress:   . Feeling of Stress : Not on file  Social Connections:   . Frequency of Communication with Friends and Family: Not on file  . Frequency of Social Gatherings with Friends and Family: Not on file  . Attends Religious Services: Not on file  . Active Member of Clubs or Organizations: Not on file  . Attends Archivist Meetings: Not on file  . Marital Status: Not on file     Family History: The patient's family history includes Diabetes in his father; Other in his mother.  ROS:   Please see the history of present illness.    Is dealing with the stress of Covid.  He is concerned now that he is on Metformin.  Was started  several days ago.  Unable to take atorvastatin because of muscle aching.  Was placed on rosuvastatin 5 mg/day but has not gotten it refilled.  Recent laboratory data has been reviewed.  Cholesterol is significantly elevated.  All other systems reviewed and are negative.  EKGs/Labs/Other Studies Reviewed:    The following studies were reviewed today:  EXERCISE STRESS TEST 2019 Study Highlights   Blood pressure demonstrated a normal response to exercise.  Upsloping ST segment depression ST segment depression was noted during stress in the V6, V5, V4, aVF, III and II leads, and returning to baseline after less than 1 minute of recovery.  Negative, adequate stress test.  EKG:  EKG normal EKG with normal sinus rhythm.  Recent Labs: No results found for requested labs within last 8760 hours.  Recent Lipid Panel    Component Value Date/Time   CHOL 144 05/10/2017 0938   TRIG 81 05/10/2017 0938   HDL 35 (L) 05/10/2017 0938   CHOLHDL 4.1 05/10/2017 0938   LDLCALC 93 05/10/2017 0938    Physical Exam:    VS:  BP 134/84   Pulse 69   Ht 5\' 6"  (1.676 m)   Wt 279 lb (126.6 kg)   SpO2 98%   BMI 45.03 kg/m     Wt Readings from Last 3 Encounters:  11/29/19 279 lb (126.6 kg)  03/29/17 256 lb 12.8 oz (116.5 kg)  02/28/17 260 lb (117.9 kg)     GEN: Morbid obesity. No acute distress HEENT: Normal NECK: No JVD. LYMPHATICS: No lymphadenopathy CARDIAC:  RRR without murmur, gallop, or edema. VASCULAR:  Normal Pulses. No bruits. RESPIRATORY:  Clear to auscultation without rales, wheezing or rhonchi  ABDOMEN: Soft, non-tender, non-distended, No pulsatile mass, MUSCULOSKELETAL: No deformity  SKIN: Warm and dry NEUROLOGIC:  Alert and oriented x 3 PSYCHIATRIC:  Normal affect   ASSESSMENT:    1. Essential hypertension   2. Hypercholesterolemia   3. Chest discomfort   4. Overweight   5. Educated about COVID-19 virus infection    PLAN:    In order of problems listed  above:  1. Blood pressure control could be better.  Diastolic needs to be under 80 mmHg.  Low-salt diet and weight loss encouraged.  Exercise encouraged. 2. Resume Crestor 5 mg/day.  This will not give LDL less than 70 which he needs given diabetes.  May need addition of alternative therapies and perhaps even PCSK9. 3. He has not had any chest discomfort. 4. Reviewed weight loss options including diet and exercise.  May need to consider bariatric approaches. 5. Hemoglobin A1c should be less than 7.  And early adult  in this patient with high cardiovascular risk should be an SGLT2. 6. He is vaccinated.  He is practicing mitigation measures.  Will have a booster if necessary.  Overall education and awareness concerning primary risk prevention was discussed in detail: LDL less than 70, hemoglobin A1c less than 7, blood pressure target less than 130/80 mmHg, >150 minutes of moderate aerobic activity per week, avoidance of smoking, weight control (via diet and exercise), and continued surveillance/management of/for obstructive sleep apnea.    Medication Adjustments/Labs and Tests Ordered: Current medicines are reviewed at length with the patient today.  Concerns regarding medicines are outlined above.  Orders Placed This Encounter  Procedures  . Lipid panel  . Hepatic function panel  . EKG 12-Lead   Meds ordered this encounter  Medications  . rosuvastatin (CRESTOR) 5 MG tablet    Sig: Take 1 tablet (5 mg total) by mouth daily.    Dispense:  90 tablet    Refill:  3    Patient Instructions  Medication Instructions:  1) START Rosuvastatin 5mg  once daily  *If you need a refill on your cardiac medications before your next appointment, please call your pharmacy*   Lab Work: Lipid and Liver in 6 weeks.  You will need to be fasting for these labs (nothing to eat or drink after midnight except water and black coffee).  If you have labs (blood work) drawn today and your tests are completely  normal, you will receive your results only by: Marland Kitchen MyChart Message (if you have MyChart) OR . A paper copy in the mail If you have any lab test that is abnormal or we need to change your treatment, we will call you to review the results.   Testing/Procedures: None   Follow-Up: At [facility], you and your health needs are our priority.  As part of our continuing mission to provide you with exceptional heart care, we have created designated Provider Care Teams.  These Care Teams include your primary Cardiologist (physician) and Advanced Practice Providers (APPs -  Physician Assistants and Nurse Practitioners) who all work together to provide you with the care you need, when you need it.  We recommend signing up for the patient portal called "MyChart".  Sign up information is provided on this After Visit Summary.  MyChart is used to connect with patients for Virtual Visits (Telemedicine).  Patients are able to view lab/test results, encounter notes, upcoming appointments, etc.  Non-urgent messages can be sent to your provider as well.   To learn more about what you can do with MyChart, go to [email or web address].    Your next appointment:   12 month(s)  The format for your next appointment:   In Person  Provider:   You may see Sinclair Grooms, MD or one of the following Advanced Practice Providers on your designated Care Team:    Truitt Merle, NP  Cecilie Kicks, NP  Kathyrn Drown, NP    Other Instructions  Your provider recommends that you obtain greater than 150 minutes per week of moderate aerobic activity.  Your physician recommends that you decrease carbohydrates in your diet.       Signed, Sinclair Grooms, MD  11/29/2019 9:45 AM    [facility]

## 2019-11-29 ENCOUNTER — Encounter: Payer: Self-pay | Admitting: Interventional Cardiology

## 2019-11-29 ENCOUNTER — Other Ambulatory Visit: Payer: Self-pay

## 2019-11-29 ENCOUNTER — Ambulatory Visit: Payer: 59 | Admitting: Interventional Cardiology

## 2019-11-29 VITALS — BP 134/84 | HR 69 | Ht 66.0 in | Wt 279.0 lb

## 2019-11-29 DIAGNOSIS — E663 Overweight: Secondary | ICD-10-CM | POA: Diagnosis not present

## 2019-11-29 DIAGNOSIS — E7800 Pure hypercholesterolemia, unspecified: Secondary | ICD-10-CM

## 2019-11-29 DIAGNOSIS — R0789 Other chest pain: Secondary | ICD-10-CM

## 2019-11-29 DIAGNOSIS — I10 Essential (primary) hypertension: Secondary | ICD-10-CM | POA: Diagnosis not present

## 2019-11-29 DIAGNOSIS — Z7189 Other specified counseling: Secondary | ICD-10-CM

## 2019-11-29 MED ORDER — ROSUVASTATIN CALCIUM 5 MG PO TABS: 5.0000 mg | ORAL_TABLET | Freq: Every day | ORAL | 3 refills | Status: DC

## 2019-11-29 NOTE — Patient Instructions (Signed)
Medication Instructions:  1) START Rosuvastatin 5mg  once daily  *If you need a refill on your cardiac medications before your next appointment, please call your pharmacy*   Lab Work: Lipid and Liver in 6 weeks.  You will need to be fasting for these labs (nothing to eat or drink after midnight except water and black coffee).  If you have labs (blood work) drawn today and your tests are completely normal, you will receive your results only by: Marland Kitchen MyChart Message (if you have MyChart) OR . A paper copy in the mail If you have any lab test that is abnormal or we need to change your treatment, we will call you to review the results.   Testing/Procedures: None   Follow-Up: At [facility], you and your health needs are our priority.  As part of our continuing mission to provide you with exceptional heart care, we have created designated Provider Care Teams.  These Care Teams include your primary Cardiologist (physician) and Advanced Practice Providers (APPs -  Physician Assistants and Nurse Practitioners) who all work together to provide you with the care you need, when you need it.  We recommend signing up for the patient portal called "MyChart".  Sign up information is provided on this After Visit Summary.  MyChart is used to connect with patients for Virtual Visits (Telemedicine).  Patients are able to view lab/test results, encounter notes, upcoming appointments, etc.  Non-urgent messages can be sent to your provider as well.   To learn more about what you can do with MyChart, go to [email or web address].    Your next appointment:   12 month(s)  The format for your next appointment:   In Person  Provider:   You may see Sinclair Grooms, MD or one of the following Advanced Practice Providers on your designated Care Team:    Truitt Merle, NP  Cecilie Kicks, NP  Kathyrn Drown, NP    Other Instructions  Your provider recommends that you obtain greater than 150 minutes per  week of moderate aerobic activity.  Your physician recommends that you decrease carbohydrates in your diet.

## 2020-01-13 ENCOUNTER — Other Ambulatory Visit: Payer: 59

## 2020-01-17 ENCOUNTER — Other Ambulatory Visit: Payer: 59 | Admitting: *Deleted

## 2020-01-17 ENCOUNTER — Other Ambulatory Visit: Payer: Self-pay

## 2020-01-17 DIAGNOSIS — E7800 Pure hypercholesterolemia, unspecified: Secondary | ICD-10-CM

## 2020-01-17 DIAGNOSIS — I10 Essential (primary) hypertension: Secondary | ICD-10-CM

## 2020-01-17 LAB — LIPID PANEL
Chol/HDL Ratio: 4.7 ratio (ref 0.0–5.0)
Cholesterol, Total: 166 mg/dL (ref 100–199)
HDL: 35 mg/dL — ABNORMAL LOW (ref >39)
LDL Chol Calc (NIH): 110 mg/dL — ABNORMAL HIGH (ref 0–99)
Triglycerides: 117 mg/dL (ref 0–149)
VLDL Cholesterol Cal: 21 mg/dL (ref 5–40)

## 2020-01-17 LAB — HEPATIC FUNCTION PANEL
ALT: 61 IU/L — ABNORMAL HIGH (ref 0–44)
AST: 51 IU/L — ABNORMAL HIGH (ref 0–40)
Albumin: 4.3 g/dL (ref 3.8–4.9)
Alkaline Phosphatase: 63 IU/L (ref 44–121)
Bilirubin Total: 0.8 mg/dL (ref 0.0–1.2)
Bilirubin, Direct: 0.23 mg/dL (ref 0.00–0.40)
Total Protein: 6.7 g/dL (ref 6.0–8.5)

## 2020-01-20 ENCOUNTER — Other Ambulatory Visit: Payer: Self-pay | Admitting: *Deleted

## 2020-01-20 DIAGNOSIS — E7800 Pure hypercholesterolemia, unspecified: Secondary | ICD-10-CM

## 2020-04-20 ENCOUNTER — Other Ambulatory Visit: Payer: 59

## 2020-04-29 ENCOUNTER — Other Ambulatory Visit: Payer: Self-pay | Admitting: Sports Medicine

## 2020-04-29 DIAGNOSIS — M25512 Pain in left shoulder: Secondary | ICD-10-CM

## 2020-05-01 ENCOUNTER — Other Ambulatory Visit: Payer: 59

## 2020-05-02 ENCOUNTER — Other Ambulatory Visit: Payer: 59

## 2020-05-05 ENCOUNTER — Ambulatory Visit
Admission: RE | Admit: 2020-05-05 | Discharge: 2020-05-05 | Disposition: A | Discharge status: Home / Self Care | Payer: 59 | Source: Ambulatory Visit | Attending: Sports Medicine | Admitting: Sports Medicine

## 2020-05-05 ENCOUNTER — Other Ambulatory Visit: Payer: Self-pay

## 2020-05-05 DIAGNOSIS — M25512 Pain in left shoulder: Secondary | ICD-10-CM

## 2020-05-07 ENCOUNTER — Other Ambulatory Visit: Payer: 59

## 2020-05-12 ENCOUNTER — Other Ambulatory Visit: Payer: 59

## 2020-05-13 ENCOUNTER — Other Ambulatory Visit: Payer: 59

## 2020-05-15 ENCOUNTER — Telehealth: Payer: Self-pay | Admitting: *Deleted

## 2020-05-15 NOTE — Telephone Encounter (Signed)
   Primary Cardiologist: Sinclair Grooms, MD  Chart reviewed as part of pre-operative protocol coverage. Patient was contacted 05/15/2020 in reference to pre-operative risk assessment for pending surgery as outlined below.  Michael Soto was last seen on 11/29/2019 by Dr. Tamala Julian.  Since that day, Michael Soto has done well from a cardiac standpoint. He can easily complete 4 METs without anginal complaints.  Therefore, based on ACC/AHA guidelines, the patient would be at acceptable risk for the planned procedure without further cardiovascular testing.   The patient was advised that if he develops new symptoms prior to surgery to contact our office to arrange for a follow-up visit, and he verbalized understanding.  Given lack of CAD, PAD, or CVA history, would be reasonable to hold aspirin 5-7 days prior to surgery if needed.   I will route this recommendation to the requesting party via Epic fax function and remove from pre-op pool. Please call with questions.  Abigail Butts, PA-C 05/15/2020, 3:05 PM

## 2020-05-15 NOTE — Telephone Encounter (Signed)
   [facility] Pre-operative Risk Assessment    HEARTCARE STAFF: - Please ensure there is not already an duplicate clearance open for this procedure. - Under Visit Info/Reason for Call, type in Other and utilize the format Clearance MM/DD/YY or Clearance TBD. Do not use dashes or single digits. - If request is for dental extraction, please clarify the # of teeth to be extracted.  Request for surgical clearance: PER CLEARANCE REQUEST "PT WANTS TO SCHEDULE ASAP"  1. What type of surgery is being performed? LEFT SHOULDER SCOPE, SUPERIOR CAPSULAR RECONSTRICTION   2. When is this surgery scheduled? TBD   3. What type of clearance is required (medical clearance vs. Pharmacy clearance to hold med vs. Both)? MEDICAL  4. Are there any medications that need to be held prior to surgery and how long? ASA    5. Practice name and name of physician performing surgery? [facility]; DR. DAX VARKEY   6. What is the office phone number? [phone]   7.   What is the office fax number? [place].   Anesthesia type (None, local, MAC, general) ? CHOICE   Julaine Hua 05/15/2020, 9:30 AM  _________________________________________________________________   (provider comments below)

## 2020-05-17 ENCOUNTER — Other Ambulatory Visit: Payer: 59

## 2020-05-19 ENCOUNTER — Other Ambulatory Visit: Payer: Self-pay

## 2020-05-19 ENCOUNTER — Encounter (HOSPITAL_BASED_OUTPATIENT_CLINIC_OR_DEPARTMENT_OTHER)
Admission: RE | Admit: 2020-05-19 | Discharge: 2020-05-19 | Disposition: A | Discharge status: Home / Self Care | Payer: 59 | Source: Ambulatory Visit | Attending: Orthopaedic Surgery | Admitting: Orthopaedic Surgery

## 2020-05-19 ENCOUNTER — Encounter (HOSPITAL_BASED_OUTPATIENT_CLINIC_OR_DEPARTMENT_OTHER): Payer: Self-pay | Admitting: Orthopaedic Surgery

## 2020-05-19 ENCOUNTER — Other Ambulatory Visit (HOSPITAL_COMMUNITY)
Admission: RE | Admit: 2020-05-19 | Discharge: 2020-05-19 | Disposition: A | Discharge status: Home / Self Care | Payer: 59 | Source: Ambulatory Visit | Attending: Orthopaedic Surgery | Admitting: Orthopaedic Surgery

## 2020-05-19 DIAGNOSIS — E7800 Pure hypercholesterolemia, unspecified: Secondary | ICD-10-CM | POA: Diagnosis not present

## 2020-05-19 DIAGNOSIS — E119 Type 2 diabetes mellitus without complications: Secondary | ICD-10-CM | POA: Diagnosis not present

## 2020-05-19 DIAGNOSIS — Z20822 Contact with and (suspected) exposure to covid-19: Secondary | ICD-10-CM | POA: Diagnosis not present

## 2020-05-19 DIAGNOSIS — Z87442 Personal history of urinary calculi: Secondary | ICD-10-CM | POA: Diagnosis not present

## 2020-05-19 DIAGNOSIS — Z01812 Encounter for preprocedural laboratory examination: Secondary | ICD-10-CM | POA: Diagnosis not present

## 2020-05-19 DIAGNOSIS — Z79899 Other long term (current) drug therapy: Secondary | ICD-10-CM | POA: Diagnosis not present

## 2020-05-19 DIAGNOSIS — Z7982 Long term (current) use of aspirin: Secondary | ICD-10-CM | POA: Diagnosis not present

## 2020-05-19 DIAGNOSIS — I10 Essential (primary) hypertension: Secondary | ICD-10-CM | POA: Diagnosis not present

## 2020-05-19 DIAGNOSIS — W19XXXA Unspecified fall, initial encounter: Secondary | ICD-10-CM | POA: Diagnosis not present

## 2020-05-19 DIAGNOSIS — Z7984 Long term (current) use of oral hypoglycemic drugs: Secondary | ICD-10-CM | POA: Diagnosis not present

## 2020-05-19 DIAGNOSIS — Z833 Family history of diabetes mellitus: Secondary | ICD-10-CM | POA: Diagnosis not present

## 2020-05-19 DIAGNOSIS — S46002A Unspecified injury of muscle(s) and tendon(s) of the rotator cuff of left shoulder, initial encounter: Secondary | ICD-10-CM | POA: Diagnosis not present

## 2020-05-19 LAB — BASIC METABOLIC PANEL
Anion gap: 10 (ref 5–15)
BUN: 13 mg/dL (ref 6–20)
CO2: 25 mmol/L (ref 22–32)
Calcium: 9.2 mg/dL (ref 8.9–10.3)
Chloride: 103 mmol/L (ref 98–111)
Creatinine, Ser: 0.87 mg/dL (ref 0.61–1.24)
GFR, Estimated: >60 mL/min (ref >60)
Glucose, Bld: 106 mg/dL — ABNORMAL HIGH (ref 70–99)
Potassium: 4.1 mmol/L (ref 3.5–5.1)
Sodium: 138 mmol/L (ref 135–145)

## 2020-05-19 LAB — SARS CORONAVIRUS 2 (TAT 6-24 HRS): SARS Coronavirus 2: NEGATIVE

## 2020-05-19 NOTE — Progress Notes (Signed)
   05/19/20 1310  OBSTRUCTIVE SLEEP APNEA  Have you ever been diagnosed with sleep apnea through a sleep study? No  Do you snore loudly (loud enough to be heard through closed doors)?  1  Do you often feel tired, fatigued, or sleepy during the daytime (such as falling asleep during driving or talking to someone)? 0  Has anyone observed you stop breathing during your sleep? 0  Do you have, or are you being treated for high blood pressure? 1  BMI more than 35 kg/m2? 1  Age > 50 (1-yes) 1  Neck circumference greater than:Male 16 inches or larger, Male 17inches or larger? 1  Male Gender (Yes=1) 1  Obstructive Sleep Apnea Score 6  Score 5 or greater  Results sent to PCP

## 2020-05-19 NOTE — H&P (Signed)
PREOPERATIVE H&P  Chief Complaint: LEFT SHOULDER ARTICULAR CARTILAGE DISORDERS, IMPINGEMENT SYNDROME, STRAIN OF MUSCLES AND TENDONS OF ROTATOR CUFF  HPI: Michael Soto is a 60 y.o. male who is scheduled for, Procedure(s): LEFT SHOULDER ARTHROSCOPY, DEBRIDEMENT, ACROMIOPLASTY, ROTATOR CUFF REPAIR, BICEP TENODESIS, SUPERIOR CAPSULAR RECONSTRUCTION.   The patient is a 60 year old with past medical history significant for diabetes and HTN. He has left shoulder pain after a fall on 04-29-20.  He has had a limited range of motion since. He said his shoulder was fine before. He has difficulty lifting his arm.  He takes care of his parents and needs his arm to be able to function.  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    His symptoms are rated as moderate to severe, and have been worsening.  This is significantly impairing activities of daily living.    Please see clinic note for further details on this patient's care.    He has elected for surgical management.   Past Medical History:  Diagnosis Date  . Acute pain of left shoulder due to trauma   . 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 Chest discomfort 03/28/2017  . Complication of anesthesia    slow to move knees after surgery  . Diabetes mellitus without complication ([place])   . Essential hypertension 03/28/2017  . External hemorrhoid 06/10/2011  . History of kidney stones 2013   at least 2  . Male genital ulcer 08/25/2011  . Prediabetes   . Pure hypercholesterolemia    Past Surgical History:  Procedure Laterality Date  . ANTERIOR CRUCIATE LIGAMENT REPAIR Left 20 yrs ago   ligament was gone had surgery meniscus was removed  . CATARACT EXTRACTION    . COLONOSCOPY WITH PROPOFOL N/A 07/11/2016   Procedure: COLONOSCOPY WITH PROPOFOL;  Surgeon: Garlan Fair, MD;  Location: [facility];  Service: Endoscopy;  Laterality: N/A;  . ewsl  2013  .  EYE SURGERY    . KNEE ARTHROSCOPY     Social History   Socioeconomic History  . Marital status: Single    Spouse name: Not on file  . Number of children: 0  . Years of education: Not on file  . Highest education level: Not on file  Occupational History  . 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 Smoking status: Never Smoker  . Smokeless tobacco: Never Used  Substance and Sexual Activity  . Alcohol use: No  . Drug use: No  . Sexual activity: Not on file  Other Topics Concern  . Not on file  Social History Narrative  . Not on file   Social Determinants of Health   Financial Resource Strain: Not on file  Food Insecurity: Not on file  Transportation Needs: Not on file  Physical Activity: Not on file  Stress: Not on file  Social Connections: Not on file   Family History  Problem Relation Age of Onset  . Other Mother        TRIPLE BYPASS  . Diabetes Father    No Known Allergies Prior to Admission medications   Medication Sig Start Date End Date Taking? Authorizing Provider  aspirin EC 81 MG tablet Take 1 tablet (81 mg total) by mouth daily. 03/29/17  Yes Belva Crome, MD  ibuprofen (ADVIL) 400 MG tablet Take 400 mg by mouth every 6 (six)  hours as needed.   Yes [provider]  losartan-hydrochlorothiazide (HYZAAR) 50-12.5 MG tablet Take 1 tablet by mouth daily. 03/29/17  Yes Belva Crome, MD  LUTEIN PO Take 1 tablet by mouth daily.   Yes [provider]  metFORMIN (GLUCOPHAGE) 500 MG tablet Take 500 mg by mouth daily. 11/27/19  Yes [provider]  Multiple Vitamins-Minerals (ONE-A-DAY MENS 50+ ADVANTAGE) TABS Take 1 tablet by mouth every other day.   Yes [provider]  rosuvastatin (CRESTOR) 5 MG tablet Take 1 tablet (5 mg total) by mouth daily. 11/29/19 02/27/20 Yes Belva Crome, MD  Turmeric 450 MG CAPS Take by mouth.   Yes [provider]  acetaminophen (TYLENOL) 325 MG tablet Take 325-650 mg by  mouth every 6 (six) hours as needed (for pain or headaches).    [provider]    ROS: All other systems have been reviewed and were otherwise negative with the exception of those mentioned in the HPI and as above.  Physical Exam: General: Alert, no acute distress Cardiovascular: No pedal edema Respiratory: No cyanosis, no use of accessory musculature GI: No organomegaly, abdomen is soft and non-tender Skin: No lesions in the area of chief complaint Neurologic: Sensation intact distally Psychiatric: Patient is competent for consent with normal mood and affect Lymphatic: No axillary or cervical lymphadenopathy  MUSCULOSKELETAL:  Left shoulder: Active forward elevation to about 100. Passive to 120.  Negative AC tenderness to palpation. Positive impingement and positive O'Brien's.   Imaging: MRI reviewed of this patient's shoulder which demonstrates full thickness tears of the supraspinatus, infraspinatus and subscapularis. These are retracted to the level of the glenoid. They look relatively chronic in nature but it is difficult to say and it may just be a recoil from the injury. Per the radiologist report there is no fatty infiltration of the muscle.    Assessment: Likely acute massive cuff tear involving the supraspinatus, infraspinatus and subscapularis.   Plan: Plan for Procedure(s): LEFT SHOULDER ARTHROSCOPY, DEBRIDEMENT, ACROMIOPLASTY, ROTATOR CUFF REPAIR, BICEP TENODESIS, SUPERIOR CAPSULAR RECONSTRUCTION  The patient has an acute injury. We think urgent surgery is warranted. We think his risk of a repeat tear are relatively high considering the nature of this tear.  The patient and Dr. Griffin Basil talked about the risks and benefits of a cuff repair involving the subscapularis, infraspinatus, supraspinatus as well as superior capsular reconstruction equipment on back up . He understands if this fails or if we are not able to obtain a good subscapularis  repair, a partial repair  would potentially be an option and likely coming back for a reverse total shoulder arthroplasty if he does not make progress.   The risks benefits and alternatives were discussed with the patient including but not limited to the risks of nonoperative treatment, versus surgical intervention including infection, bleeding, nerve injury,  blood clots, cardiopulmonary complications, morbidity, mortality, among others, and they were willing to proceed.   The patient acknowledged the explanation, agreed to proceed with the plan and consent was signed.   Patient has received operative clearance from Cardiologist, Dr. Tamala Julian, and PCP, Dr. Delfina Redwood.   Operative Plan: Left shoulder scope with SAD, BT, RCR vs SCR Discharge Medications: Tylenol, Celebrex, Oxycodone, Zofran, Robaxin DVT Prophylaxis: Resume aspirin Physical Therapy: Outpatient PT Special Discharge needs: [place], PA-C  05/19/2020 4:45 PM

## 2020-05-19 NOTE — Progress Notes (Signed)
Pt here for PAT appt and anesthesia consult with Dr. Elgie Congo.  Ok to proceed with outpt surgery Thursday.

## 2020-05-19 NOTE — Progress Notes (Signed)

## 2020-05-21 ENCOUNTER — Other Ambulatory Visit: Payer: Self-pay

## 2020-05-21 ENCOUNTER — Ambulatory Visit (HOSPITAL_BASED_OUTPATIENT_CLINIC_OR_DEPARTMENT_OTHER): Payer: 59 | Admitting: Anesthesiology

## 2020-05-21 ENCOUNTER — Encounter (HOSPITAL_BASED_OUTPATIENT_CLINIC_OR_DEPARTMENT_OTHER): Payer: Self-pay | Admitting: Orthopaedic Surgery

## 2020-05-21 ENCOUNTER — Encounter (HOSPITAL_BASED_OUTPATIENT_CLINIC_OR_DEPARTMENT_OTHER)
Admission: RE | Disposition: A | Discharge status: Home / Self Care | Payer: Self-pay | Source: Home / Self Care | Attending: Orthopaedic Surgery

## 2020-05-21 ENCOUNTER — Ambulatory Visit (HOSPITAL_BASED_OUTPATIENT_CLINIC_OR_DEPARTMENT_OTHER)
Admission: RE | Admit: 2020-05-21 | Discharge: 2020-05-21 | Disposition: A | Discharge status: Home / Self Care | Payer: 59 | Attending: Orthopaedic Surgery | Admitting: Orthopaedic Surgery

## 2020-05-21 DIAGNOSIS — Z79899 Other long term (current) drug therapy: Secondary | ICD-10-CM | POA: Insufficient documentation

## 2020-05-21 DIAGNOSIS — I10 Essential (primary) hypertension: Secondary | ICD-10-CM | POA: Insufficient documentation

## 2020-05-21 DIAGNOSIS — Z7984 Long term (current) use of oral hypoglycemic drugs: Secondary | ICD-10-CM | POA: Insufficient documentation

## 2020-05-21 DIAGNOSIS — S46002A Unspecified injury of muscle(s) and tendon(s) of the rotator cuff of left shoulder, initial encounter: Secondary | ICD-10-CM | POA: Diagnosis not present

## 2020-05-21 DIAGNOSIS — Z87442 Personal history of urinary calculi: Secondary | ICD-10-CM | POA: Insufficient documentation

## 2020-05-21 DIAGNOSIS — E119 Type 2 diabetes mellitus without complications: Secondary | ICD-10-CM | POA: Insufficient documentation

## 2020-05-21 DIAGNOSIS — W19XXXA Unspecified fall, initial encounter: Secondary | ICD-10-CM | POA: Insufficient documentation

## 2020-05-21 DIAGNOSIS — E7800 Pure hypercholesterolemia, unspecified: Secondary | ICD-10-CM | POA: Insufficient documentation

## 2020-05-21 DIAGNOSIS — Z7982 Long term (current) use of aspirin: Secondary | ICD-10-CM | POA: Insufficient documentation

## 2020-05-21 DIAGNOSIS — Z833 Family history of diabetes mellitus: Secondary | ICD-10-CM | POA: Insufficient documentation

## 2020-05-21 HISTORY — DX: Type 2 diabetes mellitus without complications: E11.9

## 2020-05-21 HISTORY — DX: Acute pain due to trauma: M25.512

## 2020-05-21 HISTORY — DX: Acute pain due to trauma: G89.11

## 2020-05-21 HISTORY — PX: SHOULDER ARTHROSCOPY WITH ROTATOR CUFF REPAIR AND SUBACROMIAL DECOMPRESSION: SHX5686

## 2020-05-21 LAB — GLUCOSE, CAPILLARY
Glucose-Capillary: 123 mg/dL — ABNORMAL HIGH (ref 70–99)
Glucose-Capillary: 182 mg/dL — ABNORMAL HIGH (ref 70–99)

## 2020-05-21 SURGERY — SHOULDER ARTHROSCOPY WITH ROTATOR CUFF REPAIR AND SUBACROMIAL DECOMPRESSION
Anesthesia: General | Site: Shoulder | Laterality: Left

## 2020-05-21 MED ORDER — DEXAMETHASONE SODIUM PHOSPHATE 10 MG/ML IJ SOLN
INTRAMUSCULAR | Status: AC
  Filled 2020-05-21: qty 1

## 2020-05-21 MED ORDER — SODIUM CHLORIDE 0.9 % IR SOLN
Status: DC | PRN
  Administered 2020-05-21: 12000 mL

## 2020-05-21 MED ORDER — HYDROMORPHONE HCL 1 MG/ML IJ SOLN: 0.2500 mg | INTRAMUSCULAR | Status: DC | PRN

## 2020-05-21 MED ORDER — OXYCODONE HCL 5 MG PO TABS: 5.0000 mg | ORAL_TABLET | Freq: Once | ORAL | Status: DC | PRN

## 2020-05-21 MED ORDER — EPHEDRINE SULFATE 50 MG/ML IJ SOLN
INTRAMUSCULAR | Status: DC | PRN
  Administered 2020-05-21 (×2): 15 mg via INTRAVENOUS

## 2020-05-21 MED ORDER — FENTANYL CITRATE (PF) 100 MCG/2ML IJ SOLN
100.0000 ug | Freq: Once | INTRAMUSCULAR | Status: AC
  Administered 2020-05-21: 100 ug via INTRAVENOUS

## 2020-05-21 MED ORDER — MIDAZOLAM HCL 2 MG/2ML IJ SOLN
INTRAMUSCULAR | Status: AC
  Filled 2020-05-21: qty 2

## 2020-05-21 MED ORDER — PROPOFOL 10 MG/ML IV BOLUS
INTRAVENOUS | Status: DC | PRN
  Administered 2020-05-21: 300 mg via INTRAVENOUS

## 2020-05-21 MED ORDER — ACETAMINOPHEN 500 MG PO TABS: 1000.0000 mg | ORAL_TABLET | Freq: Three times a day (TID) | ORAL | 0 refills | Status: AC

## 2020-05-21 MED ORDER — FENTANYL CITRATE (PF) 100 MCG/2ML IJ SOLN
INTRAMUSCULAR | Status: DC | PRN
  Administered 2020-05-21: 50 ug via INTRAVENOUS
  Administered 2020-05-21 (×2): 25 ug via INTRAVENOUS

## 2020-05-21 MED ORDER — BUPIVACAINE LIPOSOME 1.3 % IJ SUSP
INTRAMUSCULAR | Status: DC | PRN
  Administered 2020-05-21: 10 mL via PERINEURAL

## 2020-05-21 MED ORDER — ONDANSETRON HCL 4 MG/2ML IJ SOLN
INTRAMUSCULAR | Status: AC
  Filled 2020-05-21: qty 2

## 2020-05-21 MED ORDER — CEFAZOLIN SODIUM-DEXTROSE 2-3 GM-%(50ML) IV SOLR
INTRAVENOUS | Status: DC | PRN
  Administered 2020-05-21: 3 g via INTRAVENOUS

## 2020-05-21 MED ORDER — SODIUM CHLORIDE 0.9 % IR SOLN
Status: DC | PRN
  Administered 2020-05-21: 10000 mL

## 2020-05-21 MED ORDER — CELECOXIB 100 MG PO CAPS: 100.0000 mg | ORAL_CAPSULE | Freq: Two times a day (BID) | ORAL | 0 refills | Status: AC

## 2020-05-21 MED ORDER — AMISULPRIDE (ANTIEMETIC) 5 MG/2ML IV SOLN: 10.0000 mg | Freq: Once | INTRAVENOUS | Status: DC | PRN

## 2020-05-21 MED ORDER — CEFAZOLIN SODIUM-DEXTROSE 2-4 GM/100ML-% IV SOLN
INTRAVENOUS | Status: AC
  Filled 2020-05-21: qty 100

## 2020-05-21 MED ORDER — CEFAZOLIN SODIUM-DEXTROSE 1-4 GM/50ML-% IV SOLN
INTRAVENOUS | Status: AC
  Filled 2020-05-21: qty 50

## 2020-05-21 MED ORDER — LACTATED RINGERS IV SOLN: INTRAVENOUS | Status: DC | PRN

## 2020-05-21 MED ORDER — METHOCARBAMOL 500 MG PO TABS: 500.0000 mg | ORAL_TABLET | Freq: Three times a day (TID) | ORAL | 0 refills | Status: DC | PRN

## 2020-05-21 MED ORDER — BUPIVACAINE HCL (PF) 0.5 % IJ SOLN
INTRAMUSCULAR | Status: DC | PRN
  Administered 2020-05-21: 20 mL via PERINEURAL

## 2020-05-21 MED ORDER — FENTANYL CITRATE (PF) 100 MCG/2ML IJ SOLN
INTRAMUSCULAR | Status: AC
  Filled 2020-05-21: qty 2

## 2020-05-21 MED ORDER — PROPOFOL 10 MG/ML IV BOLUS
INTRAVENOUS | Status: AC
  Filled 2020-05-21: qty 40

## 2020-05-21 MED ORDER — OXYCODONE HCL 5 MG PO TABS: ORAL_TABLET | ORAL | 0 refills | Status: AC

## 2020-05-21 MED ORDER — ROCURONIUM BROMIDE 10 MG/ML (PF) SYRINGE
PREFILLED_SYRINGE | INTRAVENOUS | Status: AC
  Filled 2020-05-21: qty 10

## 2020-05-21 MED ORDER — CEFAZOLIN SODIUM-DEXTROSE 2-4 GM/100ML-% IV SOLN: 2.0000 g | INTRAVENOUS | Status: DC

## 2020-05-21 MED ORDER — PHENYLEPHRINE HCL (PRESSORS) 10 MG/ML IV SOLN
INTRAVENOUS | Status: DC | PRN
  Administered 2020-05-21: 80 ug via INTRAVENOUS
  Administered 2020-05-21: 120 ug via INTRAVENOUS
  Administered 2020-05-21: 80 ug via INTRAVENOUS
  Administered 2020-05-21 (×3): 120 ug via INTRAVENOUS
  Administered 2020-05-21: 80 ug via INTRAVENOUS
  Administered 2020-05-21: 120 ug via INTRAVENOUS
  Administered 2020-05-21: 80 ug via INTRAVENOUS

## 2020-05-21 MED ORDER — OXYCODONE HCL 5 MG/5ML PO SOLN: 5.0000 mg | Freq: Once | ORAL | Status: DC | PRN

## 2020-05-21 MED ORDER — EPINEPHRINE PF 1 MG/ML IJ SOLN
INTRAMUSCULAR | Status: AC
  Filled 2020-05-21: qty 10

## 2020-05-21 MED ORDER — DEXAMETHASONE SODIUM PHOSPHATE 10 MG/ML IJ SOLN
INTRAMUSCULAR | Status: DC | PRN
  Administered 2020-05-21: 10 mg via INTRAVENOUS

## 2020-05-21 MED ORDER — LIDOCAINE HCL (CARDIAC) PF 100 MG/5ML IV SOSY
PREFILLED_SYRINGE | INTRAVENOUS | Status: DC | PRN
  Administered 2020-05-21: 100 mg via INTRAVENOUS

## 2020-05-21 MED ORDER — LACTATED RINGERS IV SOLN: INTRAVENOUS | Status: DC

## 2020-05-21 MED ORDER — ONDANSETRON HCL 4 MG/2ML IJ SOLN
INTRAMUSCULAR | Status: DC | PRN
  Administered 2020-05-21: 4 mg via INTRAVENOUS

## 2020-05-21 MED ORDER — LIDOCAINE 2% (20 MG/ML) 5 ML SYRINGE
INTRAMUSCULAR | Status: AC
  Filled 2020-05-21: qty 5

## 2020-05-21 MED ORDER — MEPERIDINE HCL 25 MG/ML IJ SOLN: 6.2500 mg | INTRAMUSCULAR | Status: DC | PRN

## 2020-05-21 MED ORDER — MIDAZOLAM HCL 2 MG/2ML IJ SOLN
2.0000 mg | Freq: Once | INTRAMUSCULAR | Status: AC
  Administered 2020-05-21: 2 mg via INTRAVENOUS

## 2020-05-21 MED ORDER — ONDANSETRON HCL 4 MG PO TABS: 4.0000 mg | ORAL_TABLET | Freq: Three times a day (TID) | ORAL | 1 refills | Status: AC | PRN

## 2020-05-21 MED ORDER — PROMETHAZINE HCL 25 MG/ML IJ SOLN: 6.2500 mg | INTRAMUSCULAR | Status: DC | PRN

## 2020-05-21 SURGICAL SUPPLY — 67 items
AID PSTN UNV HD RSTRNT DISP (MISCELLANEOUS) ×2
ANCH SUT FLT FBRTK 1.3 TPE BRD (Anchor) ×2 IMPLANT
ANCH SUT SWLK 19.1X4.75 (Anchor) ×6 IMPLANT
ANCHOR SUT 2.6 FBRTK DBL SUTP (Anchor) ×3 IMPLANT
ANCHOR SUT BIO SW 4.75X19.1 (Anchor) ×9 IMPLANT
APL PRP STRL LF DISP 70% ISPRP (MISCELLANEOUS) ×2
BLADE EXCALIBUR 4.0X13 (MISCELLANEOUS) ×3 IMPLANT
BLADE SURG 10 STRL SS (BLADE) IMPLANT
BURR OVAL 8 FLU 4.0X13 (MISCELLANEOUS) ×3 IMPLANT
CANNULA 5.75X71 LONG (CANNULA) ×3 IMPLANT
CANNULA PASSPORT 5 (CANNULA) IMPLANT
CANNULA PASSPORT BUTTON 10-40 (CANNULA) ×3 IMPLANT
CANNULA TWIST IN 8.25X7CM (CANNULA) ×3 IMPLANT
CHLORAPREP W/TINT 26 (MISCELLANEOUS) ×3 IMPLANT
CLSR STERI-STRIP ANTIMIC 1/2X4 (GAUZE/BANDAGES/DRESSINGS) ×6 IMPLANT
COOLER ICEMAN CLASSIC (MISCELLANEOUS) ×3 IMPLANT
COVER WAND RF STERILE (DRAPES) IMPLANT
DRAPE IMP U-DRAPE 54X76 (DRAPES) ×3 IMPLANT
DRAPE INCISE IOBAN 66X45 STRL (DRAPES) IMPLANT
DRAPE SHOULDER BEACH CHAIR (DRAPES) ×3 IMPLANT
DRSG PAD ABDOMINAL 8X10 ST (GAUZE/BANDAGES/DRESSINGS) ×3 IMPLANT
DW OUTFLOW CASSETTE/TUBE SET (MISCELLANEOUS) ×3 IMPLANT
GAUZE SPONGE 4X4 12PLY STRL (GAUZE/BANDAGES/DRESSINGS) ×3 IMPLANT
GLOVE ECLIPSE 8.0 STRL XLNG CF (GLOVE) ×3 IMPLANT
GLOVE SRG 8 PF TXTR STRL LF DI (GLOVE) ×2 IMPLANT
GLOVE SURG ENC MOIS LTX SZ6.5 (GLOVE) IMPLANT
GLOVE SURG LTX SZ6.5 (GLOVE) ×6 IMPLANT
GLOVE SURG UNDER POLY LF SZ6.5 (GLOVE) ×3 IMPLANT
GLOVE SURG UNDER POLY LF SZ7 (GLOVE) ×9 IMPLANT
GLOVE SURG UNDER POLY LF SZ8 (GLOVE) ×3
GOWN STRL REUS W/ TWL LRG LVL3 (GOWN DISPOSABLE) ×6 IMPLANT
GOWN STRL REUS W/TWL LRG LVL3 (GOWN DISPOSABLE) ×9
GOWN STRL REUS W/TWL XL LVL3 (GOWN DISPOSABLE) ×3 IMPLANT
IMPL SPEEDBRIDGE KIT (Orthopedic Implant) ×2 IMPLANT
IMPLANT SPEEDBRIDGE KIT (Orthopedic Implant) ×3 IMPLANT
IV NS IRRIG 3000ML ARTHROMATIC (IV SOLUTION) ×24 IMPLANT
KIT LEG STABILIZATION (KITS) ×3 IMPLANT
KIT STABILIZATION SHOULDER (MISCELLANEOUS) IMPLANT
KIT STR SPEAR 1.8 FBRTK DISP (KITS) IMPLANT
LASSO CRESCENT QUICKPASS (SUTURE) ×3 IMPLANT
LOOP 2 FIBERLINK CLOSED (SUTURE) ×3 IMPLANT
MANIFOLD NEPTUNE II (INSTRUMENTS) ×3 IMPLANT
NDL SAFETY ECLIPSE 18X1.5 (NEEDLE) ×2 IMPLANT
NEEDLE HYPO 18GX1.5 SHARP (NEEDLE) ×3
NEEDLE SCORPION MULTI FIRE (NEEDLE) IMPLANT
PACK ARTHROSCOPY DSU (CUSTOM PROCEDURE TRAY) ×3 IMPLANT
PACK BASIN DAY SURGERY FS (CUSTOM PROCEDURE TRAY) ×3 IMPLANT
PAD COLD SHLDR WRAP-ON (PAD) ×3 IMPLANT
PAD ORTHO SHOULDER 7X19 LRG (SOFTGOODS) ×3 IMPLANT
PORT APPOLLO RF 90DEGREE MULTI (SURGICAL WAND) ×3 IMPLANT
RESTRAINT HEAD UNIVERSAL NS (MISCELLANEOUS) ×3 IMPLANT
SHEET MEDIUM DRAPE 40X70 STRL (DRAPES) ×3 IMPLANT
SLEEVE SCD COMPRESS KNEE MED (MISCELLANEOUS) ×3 IMPLANT
SLING ARM FOAM STRAP LRG (SOFTGOODS) IMPLANT
SUT FIBERWIRE #2 38 T-5 BLUE (SUTURE)
SUT MNCRL AB 4-0 PS2 18 (SUTURE) ×6 IMPLANT
SUT PDS AB 1 CT  36 (SUTURE) ×3
SUT PDS AB 1 CT 36 (SUTURE) ×2 IMPLANT
SUT TIGER TAPE 7 IN WHITE (SUTURE) IMPLANT
SUTURE FIBERWR #2 38 T-5 BLUE (SUTURE) IMPLANT
SUTURE TAPE TIGERLINK 1.3MM BL (SUTURE) IMPLANT
SUTURETAPE TIGERLINK 1.3MM BL (SUTURE)
SYR 5ML LL (SYRINGE) ×3 IMPLANT
TAPE FIBER 2MM 7IN #2 BLUE (SUTURE) ×3 IMPLANT
TOWEL GREEN STERILE FF (TOWEL DISPOSABLE) ×6 IMPLANT
TUBE CONNECTING 20X1/4 (TUBING) ×3 IMPLANT
TUBING ARTHROSCOPY IRRIG 16FT (MISCELLANEOUS) ×3 IMPLANT

## 2020-05-21 NOTE — Anesthesia Postprocedure Evaluation (Signed)
Anesthesia Post Note  Patient: Michael Soto  Procedure(s) Performed: LEFT SHOULDER ARTHROSCOPY, DEBRIDEMENT, ACROMIOPLASTY, ROTATOR CUFF REPAIR (Left Shoulder)     Patient location during evaluation: PACU Anesthesia Type: General Level of consciousness: awake and alert Pain management: pain level controlled Vital Signs Assessment: post-procedure vital signs reviewed and stable Respiratory status: spontaneous breathing, nonlabored ventilation and respiratory function stable Cardiovascular status: blood pressure returned to baseline and stable Postop Assessment: no apparent nausea or vomiting Anesthetic complications: no   No complications documented.  Last Vitals:  Vitals:   05/21/20 0930 05/21/20 0945  BP: 122/78 120/77  Pulse: 83 90  Resp: (!) 22 (!) 25  Temp:    SpO2: 99% 100%    Last Pain:  Vitals:   05/21/20 0945  TempSrc:   PainSc: (P) 0-No pain                 Lynda Rainwater

## 2020-05-21 NOTE — Discharge Instructions (Signed)
Ophelia Charter MD, MPH Noemi Chapel, PA-C [facility] [address], [address] [phone] (tel)   [phone] (fax)   POST-OPERATIVE INSTRUCTIONS - SHOULDER ARTHROSCOPY  WOUND CARE  You may remove the Operative Dressing on Post-Op Day #3 (72hrs after surgery).    Alternatively if you would like you can leave dressing on until follow-up if within 7-8 days but keep it dry.  Leave steri-strips in place until they fall off on their own, usually 2 weeks postop.  There may be a small amount of fluid/bleeding leaking at the surgical site.  o This is normal; the shoulder is filled with fluid during the procedure and can leak for 24-48hrs after surgery.   You may change/reinforce the bandage as needed.   Use the Cryocuff or Ice as often as possible for the first 7 days, then as needed for pain relief. Always keep a towel, ACE wrap or other barrier between the cooling unit and your skin.   You may shower on Post-Op Day #3. Gently pat the area dry. Do not soak the shoulder in water or submerge it. Keep dry incisions as dry as possible.  Do not go swimming in the pool or ocean until 4 weeks after surgery or when otherwise instructed.    EXERCISES/BRACING ? Sling should be used at all times until follow-up. (including when you sleep!) ? You can remove sling for hygiene.     Please continue to ambulate and do not stay sitting or lying for too long. Perform foot and wrist pumps to assist in circulation.  POST-OP MEDICATIONS- Multimodal approach to pain control  In general your pain will be controlled with a combination of substances.  Prescriptions unless otherwise discussed are electronically sent to your pharmacy.  This is a carefully made plan we use to minimize narcotic use.     ? Celebrex - Anti-inflammatory medication taken on a scheduled basis ? Acetaminophen - Non-narcotic pain medicine taken on a scheduled basis  ? Oxycodone - This is a strong narcotic, to  be used only on an as needed basis for pain. ? Robaxin - this is a muscle relaxer, take as needed for muscle spasms ? Zofran - take as needed for nausea  FOLLOW-UP  If you develop a Fever (?101.5), Redness or Drainage from the surgical incision site, please call our office to arrange for an evaluation.  Please call the office to schedule a follow-up appointment for your suture removal, 10-14 days post-operatively.    HELPFUL INFORMATION   If you had a block, it will wear off between 8-24 hrs postop typically.  This is period when your pain may go from nearly zero to the pain you would have had postop without the block.  This is an abrupt transition but nothing dangerous is happening.  You may take an extra dose of narcotic when this happens.   You may be more comfortable sleeping in a semi-seated position the first few nights following surgery.  Keep a pillow propped under the elbow and forearm for comfort.  If you have a recliner type of chair it might be beneficial.  If not that is fine too, but it would be helpful to sleep propped up with pillows behind your operated shoulder as well under your elbow and forearm.  This will reduce pulling on the suture lines.   When dressing, put your operative arm in the sleeve first.  When getting undressed, take your operative arm out last.  Loose fitting, button-down shirts  are recommended.  Often in the first days after surgery you may be more comfortable keeping your operative arm under your shirt and not through the sleeve.   You may return to work/school in the next couple of days when you feel up to it.  Desk work and typing in the sling is fine.   We suggest you use the pain medication the first night prior to going to bed, in order to ease any pain when the anesthesia wears off. You should avoid taking pain medications on an empty stomach as it will make you nauseous.   You should wean off your narcotic medicines as soon as you are able.   Most patients will be off or using minimal narcotics before their first postop appointment.    Do not drink alcoholic beverages or take illicit drugs when taking pain medications.   It is against the law to drive while taking narcotics.  In some states it is against the law to drive while your arm is in a sling.    Pain medication may make you constipated.  Below are a few solutions to try in this order: - Decrease the amount of pain medication if you aren't having pain. - Drink lots of decaffeinated fluids. - Drink prune juice and/or eat dried prunes   If the first 3 don't work start with additional solutions - Take Colace - an over-the-counter stool softener - Take Senokot - an over-the-counter laxative - Take Miralax - a stronger over-the-counter laxative  For more information including helpful videos and documents visit our website:   [email or web address]    Post Anesthesia Home Care Instructions  Activity: Get plenty of rest for the remainder of the day. A responsible individual must stay with you for 24 hours following the procedure.  For the next 24 hours, DO NOT: -Drive a car -Paediatric nurse -Drink alcoholic beverages -Take any medication unless instructed by your physician -Make any legal decisions or sign important papers.  Meals: Start with liquid foods such as gelatin or soup. Progress to regular foods as tolerated. Avoid greasy, spicy, heavy foods. If nausea and/or vomiting occur, drink only clear liquids until the nausea and/or vomiting subsides. Call your physician if vomiting continues.  Special Instructions/Symptoms: Your throat may feel dry or sore from the anesthesia or the breathing tube placed in your throat during surgery. If this causes discomfort, gargle with warm salt water. The discomfort should disappear within 24 hours.  If you had a scopolamine patch placed behind your ear for the management of post- operative  nausea and/or vomiting:  1. The medication in the patch is effective for 72 hours, after which it should be removed.  Wrap patch in a tissue and discard in the trash. Wash hands thoroughly with soap and water. 2. You may remove the patch earlier than 72 hours if you experience unpleasant side effects which may include dry mouth, dizziness or visual disturbances. 3. Avoid touching the patch. Wash your hands with soap and water after contact with the patch.    Regional Anesthesia Blocks  1. Numbness or the inability to move the "blocked" extremity may last from 3-48 hours after placement. The length of time depends on the medication injected and your individual response to the medication. If the numbness is not going away after 48 hours, call your surgeon.  2. The extremity that is blocked will need to be protected until the numbness is gone and the  Strength has returned. Because you cannot feel it,  you will need to take extra care to avoid injury. Because it may be weak, you may have difficulty moving it or using it. You may not know what position it is in without looking at it while the block is in effect.  3. For blocks in the legs and feet, returning to weight bearing and walking needs to be done carefully. You will need to wait until the numbness is entirely gone and the strength has returned. You should be able to move your leg and foot normally before you try and bear weight or walk. You will need someone to be with you when you first try to ensure you do not fall and possibly risk injury.  4. Bruising and tenderness at the needle site are common side effects and will resolve in a few days.  5. Persistent numbness or new problems with movement should be communicated to the surgeon or the [facility] [phone] [place] [phone]).  Information for Discharge Teaching: EXPAREL (bupivacaine liposome injectable suspension)   Your surgeon or anesthesiologist  gave you EXPAREL(bupivacaine) to help control your pain after surgery.   EXPAREL is a local anesthetic that provides pain relief by numbing the tissue around the surgical site.  EXPAREL is designed to release pain medication over time and can control pain for up to 72 hours.  Depending on how you respond to EXPAREL, you may require less pain medication during your recovery.  Possible side effects:  Temporary loss of sensation or ability to move in the area where bupivacaine was injected.  Nausea, vomiting, constipation  Rarely, numbness and tingling in your mouth or lips, lightheadedness, or anxiety may occur.  Call your doctor right away if you think you may be experiencing any of these sensations, or if you have other questions regarding possible side effects.  Follow all other discharge instructions given to you by your surgeon or nurse. Eat a healthy diet and drink plenty of water or other fluids.  If you return to the hospital for any reason within 96 hours following the administration of EXPAREL, it is important for health care providers to know that you have received this anesthetic. A teal colored band has been placed on your arm with the date, time and amount of EXPAREL you have received in order to alert and inform your health care providers. Please leave this armband in place for the full 96 hours following administration, and then you may remove the band.

## 2020-05-21 NOTE — Anesthesia Procedure Notes (Signed)
Procedure Name: LMA Insertion Performed by: Verita Lamb, CRNA Pre-anesthesia Checklist: Patient identified, Emergency Drugs available, Suction available and Patient being monitored Patient Re-evaluated:Patient Re-evaluated prior to induction Oxygen Delivery Method: Circle system utilized Preoxygenation: Pre-oxygenation with 100% oxygen Induction Type: IV induction Ventilation: Mask ventilation without difficulty LMA: LMA inserted LMA Size: 4.0 Tube type: Oral Number of attempts: 1 Airway Equipment and Method: Bite block Placement Confirmation: positive ETCO2,  CO2 detector and breath sounds checked- equal and bilateral Tube secured with: Tape Dental Injury: Teeth and Oropharynx as per pre-operative assessment

## 2020-05-21 NOTE — Op Note (Signed)
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 Chondral surfaces:Intact, no sign of chondral degeneration on the glenoid or humeral head Biceps: Medialized into the subscapularis footprint, tenotomy performed Subscapularis: Full-thickness subscapularis tear with retraction.  3 sided release necessary to encourage approximation to the tuberosity. Superior Cuff: Massive tear of all of the supra, all of the infra and the leading edge of the teres. Bursal side: As above  Successful completion of the planned procedure.  This was an atypical presentation as the patient had a traumatic injury however his subscapularis clearly had had a partial injury before and his superior cuff had likely had a partial-thickness tear versus a small full-thickness tear prior.  We are prepared for superior capsular reconstruction however subscapularis was the most retracted of the tendons.  We are able to mobilize and get a double row subscapularis repair with good approximation of the tissue.  The superior cuff was held in place with a 3 x 2 anchor configuration using swivel locks.  Tissue quality was moderate at best.  Quality was good.  We worry about retear for this patient due to the massive size of his tear however in the setting of his subscapularis issues if he retore or is unhappy with his function reverse total shoulder arthroplasty would be his best option.  We would not recommend a superior capsular reconstruction unless there was proof that his subscapularis had fully  healed.   Post-operative plan: The patient will be non-weightbearing in a sling for 6 weeks with therapy to start after 6 weeks.  The patient will be discharged home.  DVT prophylaxis not indicated in ambulatory upper extremity patient without known risk factors.   Pain control with PRN pain medication preferring oral medicines.  Follow up plan will be scheduled in approximately 7 days for incision check and XR.  Post-Op Diagnosis: Same Surgeons:Primary: Hiram Gash, MD Assistants:Caroline McBane PA-C Location: [facility] [place] OR ROOM 6 Anesthesia: General with Exparel interscalene block Antibiotics: Ancef 3 g Tourniquet time: None Estimated Blood Loss: Minimal Complications: None Specimens: None Implants: Implant Name Type Inv. Item Serial No. Manufacturer Lot No. LRB No. Used Action  ANCHOR FBTK 2.6 SUTURETP 1.3 - [id number] Anchor ANCHOR FBTK 2.6 SUTURETP 1.3  [employer] [id number] Left 1 Implanted  ANCHOR SUT BIO SW 4.75X19.1 - [id number] Anchor ANCHOR SUT BIO SW 4.75X19.1  [employer] [id number] Left 1 Implanted  ANCHOR SUT BIO SW 4.75X19.1 - [id number] Anchor ANCHOR SUT BIO SW 4.75X19.1  [employer] [id number] Left 1 Implanted  IMPLANT SPEEDBRIDGE KIT - [id number] Orthopedic Implant IMPLANT SPEEDBRIDGE KIT  [employer] [id number] Left 1 Implanted  ANCHOR SUT BIO SW 4.75X19.1 - [id number] Anchor ANCHOR SUT BIO SW 4.75X19.1  Rolinda Roan [id number] Left 1 Implanted    Indications for Surgery:   PIERS BAADE is a 60 y.o. male with continued shoulder pain after a fall when he landed on his contralateral side.  He had no previous reported pain in the shoulder however his MRI demonstrated subscap, supra and infraspinatus tears retracted to the glenoid.  Were prepared for superior capsular reconstruction versus repair.  The risks and benefits were explained at length including but  not limited to continued pain, cuff failure, biceps tenodesis failure, stiffness, need for further surgery and  infection.   Procedure:   Patient was correctly identified in the preoperative holding area and operative site marked.  Patient brought to OR and positioned beachchair on an Laureles table ensuring that all bony prominences were padded and the head was in an appropriate location.  Anesthesia was induced and the operative shoulder was prepped and draped in the usual sterile fashion.  Timeout was called preincision.  A standard posterior viewing portal was made after localizing the portal with a spinal needle.  An anterior accessory portal was also made.  After clearing the articular space the camera was positioned in the subacromial space.  Findings above.    Extensive debridement was performed of the anterior interval tissue, labral fraying and the bursa.  Biceps tenotomy performed.  Subacromial decompression: We made a lateral portal with spinal needle guidance. We then proceeded to debride bursal tissue extensively with a shaver and arthrocare device. At that point we continued to identify the borders of the acromion and identify the spur. We then carefully preserved the deltoid fascia and used a burr to convert the acromion to a Type 1 flat acromion without issue.   We began in the articular space with the subscapularis.  We made multiple portals and were able to place retracting graspers and were able to perform a 3 sided release releasing to the base of the coracoid of the subscapularis tendon away from neurovascular structures.  Once we had reasonable mobility we prepared the tuberosity.  We switched to a 70 degree scope to get a better visualization and used a bur and shaver to clear the tuberosity and expose bleeding bone.  Once we had good bony anatomy to heal to we then were able to use a 2.6 mm double loaded fiber tack anchor at the medial aspect of the footprint and shuttled the sutures in mattress fashion using a crescent device.  We switch back to the 30 degree scope and noted that we had good  position and with tension on the sutures good apposition of the subscapularis to bone.  We went into the subacromial space and cleared bursal tissue.  The biceps had retracted and was not amenable to biceps tenodesis.  We then viewed from the lateral portal and were able to identify the previously passed sutures.  We tied each of the sutures to its corresponding partner reducing the subscapularis primarily with this alone.  We then performed our lateral repair repair using two 4.75 mm swivel lock anchors and tensioning to provide compression and a speed bridge type manner to our tissue.  Final result was a robust repair with good tissue bone apposition.   We turned our attention to the superior cuff.  This was a massive retracted tear with moderate tissue quality at best.  We placed 3 Medial Row 4.75 mm swivel lock anchors and shuttled the sutures into corresponding portals.  We shuttled sutures through the tissue using a scorpion device obtaining good purchase.  We passed both the tapes which were not tied as well as the safety stitches which were tied and cut.  At that point we had medial row compression and pulled our tapes 2 Lateral Row suture anchors in the form of 2 4.75 mm swivel lock anchors.  With good compression of the tissue to bone and a robust repair overall.  This was an extremely difficult case secondary to the nature of his massive tear.  This is much more difficult than the standard.  The incisions were closed with absorbable monocryl and steri strips.  A sterile dressing was placed along with a sling. The patient was awoken from general anesthesia and taken to the PACU in stable condition without complication.   Noemi Chapel, PA-C, present and scrubbed throughout the case, critical for completion in a timely fashion, and for retraction, instrumentation, closure.

## 2020-05-21 NOTE — Anesthesia Preprocedure Evaluation (Signed)
Anesthesia Evaluation  Patient identified by MRN, date of birth, ID band Patient awake    Reviewed: Allergy & Precautions, NPO status , Patient's Chart, lab work & pertinent test results  Airway Mallampati: II  TM Distance: >3 FB Neck ROM: Full    Dental  (+) Teeth Intact, Dental Advisory Given   Pulmonary neg pulmonary ROS,    Pulmonary exam normal breath sounds clear to auscultation       Cardiovascular Exercise Tolerance: Good hypertension, Pt. on medications negative cardio ROS Normal cardiovascular exam Rhythm:Regular Rate:Normal     Neuro/Psych negative neurological ROS  negative psych ROS   GI/Hepatic negative GI ROS, Neg liver ROS,   Endo/Other  diabetes, Type 2Morbid obesity  Renal/GU negative Renal ROS     Musculoskeletal negative musculoskeletal ROS (+)   Abdominal (+) + obese,   Peds  Hematology negative hematology ROS (+)   Anesthesia Other Findings Day of surgery medications reviewed with the patient.  Reproductive/Obstetrics                             Anesthesia Physical  Anesthesia Plan  ASA: III  Anesthesia Plan: General   Post-op Pain Management:  Regional for Post-op pain   Induction: Intravenous  PONV Risk Score and Plan: 2 and Ondansetron, Midazolam and Treatment may vary due to age or medical condition  Airway Management Planned: LMA  Additional Equipment:   Intra-op Plan:   Post-operative Plan:   Informed Consent: I have reviewed the patients History and Physical, chart, labs and discussed the procedure including the risks, benefits and alternatives for the proposed anesthesia with the patient or authorized representative who has indicated his/her understanding and acceptance.     Dental advisory given  Plan Discussed with: CRNA and Anesthesiologist  Anesthesia Plan Comments:         Anesthesia Quick Evaluation

## 2020-05-21 NOTE — Transfer of Care (Signed)
Immediate Anesthesia Transfer of Care Note  Patient: Michael Soto  Procedure(s) Performed: LEFT SHOULDER ARTHROSCOPY, DEBRIDEMENT, ACROMIOPLASTY, ROTATOR CUFF REPAIR (Left Shoulder)  Patient Location: PACU  Anesthesia Type:General and Regional  Level of Consciousness: awake, drowsy and patient cooperative  Airway & Oxygen Therapy: Patient Spontanous Breathing and Patient connected to face mask oxygen  Post-op Assessment: Report given to RN and Post -op Vital signs reviewed and stable  Post vital signs: Reviewed and stable  Last Vitals:  Vitals Value Taken Time  BP    Temp    Pulse 86 05/21/20 0926  Resp    SpO2 97 % 05/21/20 0926  Vitals shown include unvalidated device data.  Last Pain:  Vitals:   05/21/20 0629  TempSrc: Oral  PainSc: 2       Patients Stated Pain Goal: 2 (11/22/86 7195)  Complications: No complications documented.

## 2020-05-21 NOTE — Anesthesia Procedure Notes (Signed)
Anesthesia Regional Block: Interscalene brachial plexus block   Pre-Anesthetic Checklist: ,, timeout performed, Correct Patient, Correct Site, Correct Laterality, Correct Procedure, Correct Position, site marked, Risks and benefits discussed,  Surgical consent,  Pre-op evaluation,  At surgeon's request and post-op pain management  Laterality: Left  Prep: chloraprep       Needles:  Injection technique: Single-shot  Needle Type: Stimiplex     Needle Length: 9cm  Needle Gauge: 21     Additional Needles:   Procedures:,,,, ultrasound used (permanent image in chart),,,,  Narrative:  Start time: 05/21/2020 7:00 AM End time: 05/21/2020 7:05 AM Injection made incrementally with aspirations every 5 mL.  Performed by: Personally  Anesthesiologist: Lynda Rainwater, MD

## 2020-05-21 NOTE — Progress Notes (Signed)
Assisted Dr. Miller with left, ultrasound guided, interscalene  block. Side rails up, monitors on throughout procedure. See vital signs in flow sheet. Tolerated Procedure well.  

## 2020-05-21 NOTE — Interval H&P Note (Signed)
History and Physical Interval Note:  05/21/2020 7:14 AM  Michael Soto  has presented today for surgery, with the diagnosis of LEFT SHOULDER ARTICULAR CARTILAGE DISORDERS, IMPINGEMENT SYNDROME, STRAIN OF MUSCLES AND TENDONS OF ROTATOR CUFF.  The various methods of treatment have been discussed with the patient and family. After consideration of risks, benefits and other options for treatment, the patient has consented to  Procedure(s): LEFT SHOULDER ARTHROSCOPY, DEBRIDEMENT, ACROMIOPLASTY, ROTATOR CUFF REPAIR, BICEP TENODESIS, SUPERIOR CAPSULAR RECONSTRUCTION (Left) as a surgical intervention.  The patient's history has been reviewed, patient examined, no change in status, stable for surgery.  I have reviewed the patient's chart and labs.  Questions were answered to the patient's satisfaction.     Hiram Gash

## 2020-05-22 ENCOUNTER — Encounter (HOSPITAL_BASED_OUTPATIENT_CLINIC_OR_DEPARTMENT_OTHER): Payer: Self-pay | Admitting: Orthopaedic Surgery

## 2020-07-03 ENCOUNTER — Ambulatory Visit (HOSPITAL_COMMUNITY)
Admission: RE | Admit: 2020-07-03 | Discharge: 2020-07-03 | Disposition: A | Discharge status: Home / Self Care | Payer: 59 | Source: Ambulatory Visit | Attending: Orthopaedic Surgery | Admitting: Orthopaedic Surgery

## 2020-07-03 ENCOUNTER — Other Ambulatory Visit (HOSPITAL_COMMUNITY): Payer: Self-pay | Admitting: Orthopaedic Surgery

## 2020-07-03 ENCOUNTER — Other Ambulatory Visit: Payer: Self-pay

## 2020-07-03 DIAGNOSIS — M79605 Pain in left leg: Secondary | ICD-10-CM | POA: Diagnosis not present

## 2020-07-03 DIAGNOSIS — M79604 Pain in right leg: Secondary | ICD-10-CM

## 2020-07-03 DIAGNOSIS — R600 Localized edema: Secondary | ICD-10-CM | POA: Diagnosis not present

## 2020-07-03 NOTE — Progress Notes (Signed)
Bilateral lower extremity venous study completed.    Spoke to [place] PA with results.    Please see CV Proc for preliminary results.   Vonzell Schlatter, RVT

## 2020-11-23 ENCOUNTER — Other Ambulatory Visit: Payer: Self-pay | Admitting: Interventional Cardiology

## 2020-12-15 ENCOUNTER — Other Ambulatory Visit: Payer: Self-pay

## 2020-12-15 ENCOUNTER — Emergency Department (HOSPITAL_COMMUNITY)
Admission: EM | Admit: 2020-12-15 | Discharge: 2020-12-15 | Disposition: A | Discharge status: Home / Self Care | Payer: 59 | Attending: Emergency Medicine | Admitting: Emergency Medicine

## 2020-12-15 DIAGNOSIS — I10 Essential (primary) hypertension: Secondary | ICD-10-CM | POA: Insufficient documentation

## 2020-12-15 DIAGNOSIS — R112 Nausea with vomiting, unspecified: Secondary | ICD-10-CM | POA: Diagnosis present

## 2020-12-15 DIAGNOSIS — A059 Bacterial foodborne intoxication, unspecified: Secondary | ICD-10-CM

## 2020-12-15 DIAGNOSIS — Z79899 Other long term (current) drug therapy: Secondary | ICD-10-CM | POA: Insufficient documentation

## 2020-12-15 DIAGNOSIS — Z7984 Long term (current) use of oral hypoglycemic drugs: Secondary | ICD-10-CM | POA: Insufficient documentation

## 2020-12-15 DIAGNOSIS — E119 Type 2 diabetes mellitus without complications: Secondary | ICD-10-CM | POA: Insufficient documentation

## 2020-12-15 DIAGNOSIS — T6291XA Toxic effect of unspecified noxious substance eaten as food, accidental (unintentional), initial encounter: Secondary | ICD-10-CM | POA: Insufficient documentation

## 2020-12-15 DIAGNOSIS — Z7982 Long term (current) use of aspirin: Secondary | ICD-10-CM | POA: Diagnosis not present

## 2020-12-15 LAB — CBC
HCT: 46.1 % (ref 39.0–52.0)
Hemoglobin: 15.1 g/dL (ref 13.0–17.0)
MCH: 31.1 pg (ref 26.0–34.0)
MCHC: 32.8 g/dL (ref 30.0–36.0)
MCV: 95.1 fL (ref 80.0–100.0)
Platelets: 169 10*3/uL (ref 150–400)
RBC: 4.85 MIL/uL (ref 4.22–5.81)
RDW: 13.8 % (ref 11.5–15.5)
WBC: 11.4 10*3/uL — ABNORMAL HIGH (ref 4.0–10.5)
nRBC: 0 % (ref 0.0–0.2)

## 2020-12-15 LAB — COMPREHENSIVE METABOLIC PANEL
ALT: 72 U/L — ABNORMAL HIGH (ref 0–44)
AST: 68 U/L — ABNORMAL HIGH (ref 15–41)
Albumin: 4 g/dL (ref 3.5–5.0)
Alkaline Phosphatase: 60 U/L (ref 38–126)
Anion gap: 10 (ref 5–15)
BUN: 19 mg/dL (ref 6–20)
CO2: 22 mmol/L (ref 22–32)
Calcium: 9.3 mg/dL (ref 8.9–10.3)
Chloride: 105 mmol/L (ref 98–111)
Creatinine, Ser: 0.97 mg/dL (ref 0.61–1.24)
GFR, Estimated: >60 mL/min (ref >60)
Glucose, Bld: 202 mg/dL — ABNORMAL HIGH (ref 70–99)
Potassium: 3.8 mmol/L (ref 3.5–5.1)
Sodium: 137 mmol/L (ref 135–145)
Total Bilirubin: 0.8 mg/dL (ref 0.3–1.2)
Total Protein: 7.2 g/dL (ref 6.5–8.1)

## 2020-12-15 LAB — LIPASE, BLOOD: Lipase: 29 U/L (ref 11–51)

## 2020-12-15 MED ORDER — ONDANSETRON 4 MG PO TBDP
4.0000 mg | ORAL_TABLET | Freq: Once | ORAL | Status: AC
  Administered 2020-12-15: 4 mg via ORAL
  Filled 2020-12-15: qty 1

## 2020-12-15 MED ORDER — SODIUM CHLORIDE 0.9 % IV BOLUS
1000.0000 mL | Freq: Once | INTRAVENOUS | Status: AC
  Administered 2020-12-15: 1000 mL via INTRAVENOUS

## 2020-12-15 MED ORDER — ONDANSETRON 4 MG PO TBDP: 4.0000 mg | ORAL_TABLET | Freq: Three times a day (TID) | ORAL | 0 refills | Status: DC | PRN

## 2020-12-15 NOTE — ED Provider Notes (Signed)
[facility] DEPARTMENT Provider Note   CSN: [id number] Arrival date & time: 12/15/20  0545     History Chief Complaint  Patient presents with   Emesis   Nausea    Michael Soto is a 60 y.o. male with a past medical history significant for hypertension diabetes who presents with 2 and half hours of nausea and vomiting.  Patient reports he ate to cooked hamburgers that had been left out for several hours on the counter around midnight last night and went to bed, was woken up from sleep by sudden wave of nausea and vomiting.  He reports he has vomited about half a dozen times since then.  He is still feeling somewhat nauseous mostly when he moves.  Patient denies fever, constipation, diarrhea, other recent illness.  Denies chest pain, shortness of breath.  Patient has not taken anything for the nausea or vomiting yet   Emesis Associated symptoms: no abdominal pain and no diarrhea       Past Medical History:  Diagnosis Date   Acute pain of left shoulder due to trauma    Candidiasis of penis 08/25/2011   Chest discomfort [id number]   Complication of anesthesia    slow to move knees after surgery   Diabetes mellitus without complication ([place])    Essential hypertension 03/28/2017   External hemorrhoid 06/10/2011   History of kidney stones 2013   at least 2   Male genital ulcer 08/25/2011   Prediabetes    Pure hypercholesterolemia     Patient Active Problem List   Diagnosis Date Noted   Essential hypertension 03/28/2017   Chest discomfort 03/28/2017   Gastroenteritis and colitis, viral 03/28/2017   Hypercholesterolemia 03/28/2017   Kidney stone 03/28/2017   Overweight 03/28/2017   Candidiasis of penis 08/25/2011   External hemorrhoid 06/10/2011    Past Surgical History:  Procedure Laterality Date   ANTERIOR CRUCIATE LIGAMENT REPAIR Left 20 yrs ago   ligament was gone had surgery meniscus was removed   CATARACT EXTRACTION     COLONOSCOPY WITH  PROPOFOL N/A 07/11/2016   Procedure: COLONOSCOPY WITH PROPOFOL;  Surgeon: Garlan Fair, MD;  Location: [facility];  Service: Endoscopy;  Laterality: N/A;   ewsl  2013   EYE SURGERY     KNEE ARTHROSCOPY     SHOULDER ARTHROSCOPY WITH ROTATOR CUFF REPAIR AND SUBACROMIAL DECOMPRESSION Left 05/21/2020   Procedure: LEFT SHOULDER ARTHROSCOPY, DEBRIDEMENT, ACROMIOPLASTY, ROTATOR CUFF REPAIR;  Surgeon: Hiram Gash, MD;  Location: [facility];  Service: Orthopedics;  Laterality: Left;       Family History  Problem Relation Age of Onset   Other Mother        TRIPLE BYPASS   Diabetes Father     Social History   Tobacco Use   Smoking status: Never   Smokeless tobacco: Never  Substance Use Topics   Alcohol use: No   Drug use: No    Home Medications Prior to Admission medications   Medication Sig Start Date End Date Taking? Authorizing Provider  ondansetron (ZOFRAN ODT) 4 MG disintegrating tablet Take 1 tablet (4 mg total) by mouth every 8 (eight) hours as needed for nausea or vomiting. 12/15/20  Yes Allie Gerhold H, PA-C  aspirin EC 81 MG tablet Take 1 tablet (81 mg total) by mouth daily. 03/29/17   Belva Crome, MD  losartan-hydrochlorothiazide (HYZAAR) 50-12.5 MG tablet Take 1 tablet by mouth daily. 03/29/17   Belva Crome, MD  LUTEIN PO Take 1 tablet by mouth daily.    [provider]  metFORMIN (GLUCOPHAGE) 500 MG tablet Take 500 mg by mouth daily. 11/27/19   [provider]  methocarbamol (ROBAXIN) 500 MG tablet Take 1 tablet (500 mg total) by mouth every 8 (eight) hours as needed for muscle spasms. 05/21/20   McBane, Maylene Roes, PA-C  Multiple Vitamins-Minerals (ONE-A-DAY MENS 50+ ADVANTAGE) TABS Take 1 tablet by mouth every other day.    [provider]  rosuvastatin (CRESTOR) 5 MG tablet TAKE 1 TABLET(5 MG) BY MOUTH DAILY 11/23/20   Belva Crome, MD  Turmeric 450 MG CAPS Take by mouth.    [provider]    Allergies     Patient has no known allergies.  Review of Systems   Review of Systems  Respiratory:  Negative for chest tightness and shortness of breath.   Cardiovascular:  Negative for chest pain.  Gastrointestinal:  Positive for nausea and vomiting. Negative for abdominal pain, blood in stool, constipation and diarrhea.  All other systems reviewed and are negative.  Physical Exam Updated Vital Signs BP (!) 140/96 (BP Location: Right Arm)   Pulse 72   Temp 97.6 F (36.4 C) (Oral)   Resp 18   Ht '5\' 6"'$  (1.676 m)   Wt 117.9 kg   SpO2 92%   BMI 41.97 kg/m   Physical Exam Vitals and nursing note reviewed.  Constitutional:      Appearance: Normal appearance.     Comments: Pale somewhat ill-appearing man lying in stretcher in no acute distress  HENT:     Head: Normocephalic and atraumatic.  Eyes:     General:        Right eye: No discharge.        Left eye: No discharge.  Cardiovascular:     Rate and Rhythm: Normal rate and regular rhythm.     Heart sounds: No murmur heard.   No friction rub. No gallop.  Pulmonary:     Effort: Pulmonary effort is normal.     Breath sounds: Normal breath sounds.  Abdominal:     General: Bowel sounds are normal.     Palpations: Abdomen is soft.     Comments: No tenderness to palpation across entire abdomen.  No rebound, rigidity or guarding  Skin:    General: Skin is warm and dry.     Capillary Refill: Capillary refill takes less than 2 seconds.  Neurological:     Mental Status: He is alert and oriented to person, place, and time.  Psychiatric:        Mood and Affect: Mood normal.        Behavior: Behavior normal.    ED Results / Procedures / Treatments   Labs (all labs ordered are listed, but only abnormal results are displayed) Labs Reviewed  COMPREHENSIVE METABOLIC PANEL - Abnormal; Notable for the following components:      Result Value   Glucose, Bld 202 (*)    AST 68 (*)    ALT 72 (*)    All other components within normal limits  CBC  - Abnormal; Notable for the following components:   WBC 11.4 (*)    All other components within normal limits  LIPASE, BLOOD    EKG None  Radiology No results found.  Procedures Procedures   Medications Ordered in ED Medications  sodium chloride 0.9 % bolus 1,000 mL (1,000 mLs Intravenous New Bag/Given 12/15/20 0652)  ondansetron (ZOFRAN-ODT) disintegrating tablet 4  mg (4 mg Oral Given 12/15/20 [id number])    ED Course  I have reviewed the triage vital signs and the nursing notes.  Pertinent labs & imaging results that were available during my care of the patient were reviewed by me and considered in my medical decision making (see chart for details).    MDM Rules/Calculators/A&P                         Given a clear history of ingestion of room temperature food left out for hours, favor food poisoning as the etiology for patient's transient nausea and vomiting.  Will check basic labs, hydrate, and provide supportive care for nausea.  Do not favor acute abdomen at this time, no exam findings to support any intra-abdominal pathology.   I personally reviewed all laboratory work and imaging.  Metabolic panel without any acute abnormality specifically kidney function within normal limits and no significant electrolyte abnormalities other than a mild hyperglycemia. CBC with mild leukocytosis without white shift. No significant anemia.  Reassessed after he received some Zofran and IV fluids, at this time he reports minimal nausea, no urge to vomit.  Otherwise feels like himself.  Discussed we will provide a prescription of Zofran in case nausea returns later in the day, otherwise patient appears stable to go home.  Return precautions discussed. Final Clinical Impression(s) / ED Diagnoses Final diagnoses:  Food poisoning    Rx / DC Orders ED Discharge Orders          Ordered    ondansetron (ZOFRAN ODT) 4 MG disintegrating tablet  Every 8 hours PRN        12/15/20 0734              Anselmo Pickler, PA-C 12/15/20 0739    Fatima Blank, MD 12/15/20 [phone]

## 2020-12-15 NOTE — Discharge Instructions (Addendum)
Please return if you begin to have increasing abdominal pain, intractable nausea and vomiting despite medication use, or symptoms do not resolve in the next 1 to 2 days.

## 2020-12-15 NOTE — ED Triage Notes (Signed)
Pt brought in by EMS for nausea and vomiting since about 0400.

## 2021-12-29 NOTE — Progress Notes (Signed)
Office Visit    Patient Name: Michael Soto Date of Encounter: 12/31/2021  Primary Care Provider:  Seward Carol, MD Primary Cardiologist:  Michael Grooms, MD Primary Electrophysiologist: None  Chief Complaint    Michael Soto is a 61 y.o. male with PMH of HTN, DM type II, hyperlipidemia, significant family history of premature CAD presents today for 1 year follow-up.  Past Medical History    Past Medical History:  Diagnosis Date   Acute pain of left shoulder due to trauma    Candidiasis of penis 08/25/2011   Chest discomfort 29/47/6546   Complication of anesthesia    slow to move knees after surgery   Diabetes mellitus without complication ([place])    Essential hypertension 03/28/2017   External hemorrhoid 06/10/2011   History of kidney stones 2013   at least 2   Male genital ulcer 08/25/2011   Prediabetes    Pure hypercholesterolemia    Past Surgical History:  Procedure Laterality Date   ANTERIOR CRUCIATE LIGAMENT REPAIR Left 20 yrs ago   ligament was gone had surgery meniscus was removed   CATARACT EXTRACTION     COLONOSCOPY WITH PROPOFOL N/A 07/11/2016   Procedure: COLONOSCOPY WITH PROPOFOL;  Surgeon: Michael Fair, MD;  Location: [facility];  Service: Endoscopy;  Laterality: N/A;   ewsl  2013   EYE SURGERY     KNEE ARTHROSCOPY     SHOULDER ARTHROSCOPY WITH ROTATOR CUFF REPAIR AND SUBACROMIAL DECOMPRESSION Left 05/21/2020   Procedure: LEFT SHOULDER ARTHROSCOPY, DEBRIDEMENT, ACROMIOPLASTY, ROTATOR CUFF REPAIR;  Surgeon: Michael Gash, MD;  Location: [facility];  Service: Orthopedics;  Laterality: Left;    Allergies  No Known Allergies  History of Present Illness    Michael Soto  is a 61 year old male with the above mention past medical history who presents today for 1 year follow-up of coronary artery disease.  Patient was initially seen by Dr. Tamala Soto in 2018 by referral for HTN and chest pain.  Patient was noted to have significant family  history of premature CAD.  Patient had a recent ED visit for left axillary discomfort.  ACS work-up was negative for MI.  Patient underwent ETT in 2019 that was normal with no ST depression and low risk.  He was lost to follow-up until 2021.  During visit patient had no complaints of chest pain or dyspnea.  Blood pressure was inadequately controlled.  Encouraged to exercise and eat a low-salt diet.  LDLs were not at goal of less than 70 and consideration of PCSK9 inhibitor was suggested.  Michael Soto presents today alone for overdue follow-up.  Since last being seen in the office patient reports that he has been under tremendous amount of role strain due to caring for both of his elderly sick parents.  He currently works 3 PM to 2 AM at [employer] and rarely has any time for exercise or healthy meals.  He admits to noncompliance with his current BP medications and pressures today were 144/90 and 152/98 on recheck.  We discussed the importance of losing weight and eating a low-sodium heart healthy diet and balancing in preventing cardiovascular disease.  I advised him to speak with his PCP regarding possibly adding Ozempic to his current medication regimen for weight loss.  Patient denies chest pain, palpitations, dyspnea, PND, orthopnea, nausea, vomiting, dizziness, syncope, edema, weight gain, or early satiety.   Home Medications    Current Outpatient Medications  Medication Sig Dispense  Refill   aspirin EC 81 MG tablet Take 81 mg by mouth 3 (three) times a week. Swallow whole.     losartan-hydrochlorothiazide (HYZAAR) 100-25 MG tablet Take 1 tablet by mouth daily. 90 tablet 3   metFORMIN (GLUCOPHAGE) 500 MG tablet Take 500 mg by mouth daily.     OVER THE COUNTER MEDICATION Take 1 capsule by mouth daily. VITAMIN C, D AND ZINC COMBINATION     rosuvastatin (CRESTOR) 10 MG tablet Take 1 tablet (10 mg total) by mouth daily. 90 tablet 3   Turmeric 450 MG CAPS Take by mouth.     No current  facility-administered medications for this visit.     Review of Systems  Please see the history of present illness.    (+) Anxiety (+) Stress  All other systems reviewed and are otherwise negative except as noted above.  Physical Exam    Wt Readings from Last 3 Encounters:  12/31/21 281 lb 3.2 oz (127.6 kg)  12/15/20 260 lb (117.9 kg)  05/21/20 270 lb 8.1 oz (122.7 kg)   VS: Vitals:   12/31/21 1104 12/31/21 1234  BP: (!) 144/90 (!) 152/98  Pulse: 71   SpO2: 98%   ,Body mass index is 45.39 kg/m.  Constitutional:      Appearance: Obese not in distress.  Neck:     Vascular: JVD normal.  Pulmonary:     Effort: Pulmonary effort is normal.     Breath sounds: No wheezing. No rales. Diminished in the bases Cardiovascular:     Normal rate. Regular rhythm. Normal S1. Normal S2.      Murmurs: There is no murmur.  Edema:    Peripheral edema absent.  Abdominal:     Palpations: Abdomen is soft non tender. There is no hepatomegaly.  Skin:    General: Skin is warm and dry.  Neurological:     General: No focal deficit present.     Mental Status: Alert and oriented to person, place and time.     Cranial Nerves: Cranial nerves are intact.  EKG/LABS/Other Studies Reviewed    ECG personally reviewed by me today -sinus rhythm with rate of 71 bpm and no acute changes compared to previous EKG in 2021   Lab Results  Component Value Date   WBC 11.4 (H) 12/15/2020   HGB 15.1 12/15/2020   HCT 46.1 12/15/2020   MCV 95.1 12/15/2020   PLT 169 12/15/2020   Lab Results  Component Value Date   CREATININE 0.97 12/15/2020   BUN 19 12/15/2020   NA 137 12/15/2020   K 3.8 12/15/2020   CL 105 12/15/2020   CO2 22 12/15/2020   Lab Results  Component Value Date   ALT 72 (H) 12/15/2020   AST 68 (H) 12/15/2020   ALKPHOS 60 12/15/2020   BILITOT 0.8 12/15/2020   Lab Results  Component Value Date   CHOL 166 01/17/2020   HDL 35 (L) 01/17/2020   LDLCALC 110 (H) 01/17/2020   TRIG 117  01/17/2020   CHOLHDL 4.7 01/17/2020    No results found for: "HGBA1C"  Assessment & Plan    1.  Essential hypertension: HYPERTENSION CONTROL Vitals:   12/31/21 1104 12/31/21 1234  BP: (!) 144/90 (!) 152/98    The patient's blood pressure is elevated above target today.  In order to address the patient's elevated BP: A current anti-hypertensive medication was adjusted today.; Follow up with primary care provider for management.; Blood pressure will be monitored at home to determine if  medication changes need to be made.      -Blood pressure today was elevated at 144/90 and was 152/98 on recheck -We will increase his Hyzaar to 100-25 mg daily -BMET in two weeks -DASH diet recommended and weight loss  2.  Hypercholesteremia: -We will increase Crestor to 10 mg daily -Recheck lipids and LFTs in 6-8 weeks -Patient encouraged to increase physical activity as tolerated and reduce consumption of processed foods  3.  History of premature CAD: -Patient has risk factors of hyperlipidemia and hypertension along with obesity. -We discussed the importance of taking all medications as prescribed in order to prevent progression of heart disease  4.  Obesity: -Current BMI is 41.97 kg/m  -Consider adding Ozempic and walking program  Disposition: Follow-up with Belva Crome III, MD or APP in 1 months    Medication Adjustments/Labs and Tests Ordered: Current medicines are reviewed at length with the patient today.  Concerns regarding medicines are outlined above.   Signed, Mable Fill, Marissa Nestle, NP 12/31/2021, 12:34 PM [place]

## 2021-12-31 ENCOUNTER — Ambulatory Visit: Payer: 59 | Attending: Nurse Practitioner | Admitting: Nurse Practitioner

## 2021-12-31 ENCOUNTER — Encounter: Payer: Self-pay | Admitting: Nurse Practitioner

## 2021-12-31 VITALS — BP 152/98 | HR 71 | Ht 66.0 in | Wt 281.2 lb

## 2021-12-31 DIAGNOSIS — E7800 Pure hypercholesterolemia, unspecified: Secondary | ICD-10-CM | POA: Diagnosis not present

## 2021-12-31 DIAGNOSIS — R0789 Other chest pain: Secondary | ICD-10-CM

## 2021-12-31 DIAGNOSIS — I10 Essential (primary) hypertension: Secondary | ICD-10-CM

## 2021-12-31 MED ORDER — LOSARTAN POTASSIUM-HCTZ 100-25 MG PO TABS: 1.0000 | ORAL_TABLET | Freq: Every day | ORAL | 3 refills | Status: DC

## 2021-12-31 MED ORDER — ROSUVASTATIN CALCIUM 10 MG PO TABS: 10.0000 mg | ORAL_TABLET | Freq: Every day | ORAL | 3 refills | Status: DC

## 2021-12-31 NOTE — Patient Instructions (Signed)
Medication Instructions:  Your physician has recommended you make the following change in your medication:   Increase to Hyzaar to 100-'25mg'$  daily. Increase Crestor to '10mg'$  daily   *If you need a refill on your cardiac medications before your next appointment, please call your pharmacy*   Lab Work: Lipids, Hepatic function panel 8 weeks If you have labs (blood work) drawn today and your tests are completely normal, you will receive your results only by: [place] (if you have MyChart) OR A paper copy in the mail If you have any lab test that is abnormal or we need to change your treatment, we will call you to review the results.   Follow-Up: At [facility], you and your health needs are our priority.  As part of our continuing mission to provide you with exceptional heart care, we have created designated Provider Care Teams.  These Care Teams include your primary Cardiologist (physician) and Advanced Practice Providers (APPs -  Physician Assistants and Nurse Practitioners) who all work together to provide you with the care you need, when you need it.   Your next appointment:   1 month(s)  The format for your next appointment:   In Person  Provider:   Ambrose Pancoast, NP

## 2022-02-01 NOTE — Progress Notes (Unsigned)
Office Visit    Patient Name: Michael Soto Date of Encounter: 02/01/2022  Primary Care Provider:  Seward Carol, MD Primary Cardiologist:  Sinclair Grooms, MD Primary Electrophysiologist: None  Chief Complaint    Michael Soto is a 61 y.o. male with PMH of HTN, DM type II, hyperlipidemia, significant family history of premature CAD presents today for 1 month follow-up of hypertension and CAD.  Past Medical History    Past Medical History:  Diagnosis Date   Acute pain of left shoulder due to trauma    Candidiasis of penis 08/25/2011   Chest discomfort 59/93/5701   Complication of anesthesia    slow to move knees after surgery   Diabetes mellitus without complication ([place])    Essential hypertension 03/28/2017   External hemorrhoid 06/10/2011   History of kidney stones 2013   at least 2   Male genital ulcer 08/25/2011   Prediabetes    Pure hypercholesterolemia    Past Surgical History:  Procedure Laterality Date   ANTERIOR CRUCIATE LIGAMENT REPAIR Left 20 yrs ago   ligament was gone had surgery meniscus was removed   CATARACT EXTRACTION     COLONOSCOPY WITH PROPOFOL N/A 07/11/2016   Procedure: COLONOSCOPY WITH PROPOFOL;  Surgeon: Garlan Fair, MD;  Location: [facility];  Service: Endoscopy;  Laterality: N/A;   ewsl  2013   EYE SURGERY     KNEE ARTHROSCOPY     SHOULDER ARTHROSCOPY WITH ROTATOR CUFF REPAIR AND SUBACROMIAL DECOMPRESSION Left 05/21/2020   Procedure: LEFT SHOULDER ARTHROSCOPY, DEBRIDEMENT, ACROMIOPLASTY, ROTATOR CUFF REPAIR;  Surgeon: Hiram Gash, MD;  Location: [facility];  Service: Orthopedics;  Laterality: Left;    Allergies  No Known Allergies  History of Present Illness    Michael Soto  is a 61 year old male with the above mention past medical history who presents today for 1 year follow-up of coronary artery disease.  Patient was initially seen by Dr. Tamala Julian in 2018 by referral for HTN and chest pain.  Patient was noted to  have significant family history of premature CAD.  Patient had a recent ED visit for left axillary discomfort.  ACS work-up was negative for MI.  Patient underwent ETT in 2019 that was normal with no ST depression and low risk.  He was lost to follow-up until 2021.  During visit patient had no complaints of chest pain or dyspnea.  Blood pressure was inadequately controlled.  Encouraged to exercise and eat a low-salt diet.  LDLs were not at goal of less than 70 and consideration of PCSK9 inhibitor was suggested.  He was seen for overdue follow-up on 12/31/2021 by me and endorsed tremendous amount of work life strain and stress.  He also reported noncompliance with his blood pressure medications and pressures were elevated at 144/90 and 152/98 on recheck.  I increased his Hyzaar to 100-25 mg and patient was encouraged to lose weight and reduce sodium consumption.  I also increased his Crestor to 10 mg daily with plan to repeat LFTs and lipids in 6 weeks  Mr. Walla presents today for 1 month follow-up alone for hypertension since last being seen in the office patient reports he has been doing well with no medication complaints.  He is watching his salt intake and denies any myalgias with increased dose of Crestor.  He is still not able to participate in a exercise routine.  During previous visit we discussed beginning a walking program after work.  He continues to be under tremendous amount of role strain with caring for his elderly parents.  He is also working full-time at [employer].  He does walk his dogs occasionally and does lots of walking at his job.  Patient denies chest pain, palpitations, dyspnea, PND, orthopnea, nausea, vomiting, dizziness, syncope, edema, weight gain, or early satiety.  Home Medications    Current Outpatient Medications  Medication Sig Dispense Refill   aspirin EC 81 MG tablet Take 81 mg by mouth 3 (three) times a week. Swallow whole.     losartan-hydrochlorothiazide (HYZAAR) 100-25  MG tablet Take 1 tablet by mouth daily. 90 tablet 3   metFORMIN (GLUCOPHAGE) 500 MG tablet Take 500 mg by mouth daily.     OVER THE COUNTER MEDICATION Take 1 capsule by mouth daily. VITAMIN C, D AND ZINC COMBINATION     rosuvastatin (CRESTOR) 10 MG tablet Take 1 tablet (10 mg total) by mouth daily. 90 tablet 3   Turmeric 450 MG CAPS Take by mouth.     No current facility-administered medications for this visit.     Review of Systems  Please see the history of present illness.     All other systems reviewed and are otherwise negative except as noted above.  Physical Exam    Wt Readings from Last 3 Encounters:  12/31/21 281 lb 3.2 oz (127.6 kg)  12/15/20 260 lb (117.9 kg)  05/21/20 270 lb 8.1 oz (122.7 kg)   RD:EYCXK were no vitals filed for this visit.,There is no height or weight on file to calculate BMI.  Constitutional:      Appearance: Healthy appearance. Not in distress.  Neck:     Vascular: JVD normal.  Pulmonary:     Effort: Pulmonary effort is normal.     Breath sounds: No wheezing. No rales. Diminished in the bases Cardiovascular:     Normal rate. Regular rhythm. Normal S1. Normal S2.      Murmurs: There is no murmur.  Edema:    Peripheral edema absent.  Abdominal:     Palpations: Abdomen is soft non tender. There is no hepatomegaly.  Skin:    General: Skin is warm and dry.  Neurological:     General: No focal deficit present.     Mental Status: Alert and oriented to person, place and time.     Cranial Nerves: Cranial nerves are intact.  EKG/LABS/Other Studies Reviewed    ECG personally reviewed by me today -none completed today    Lab Results  Component Value Date   WBC 11.4 (H) 12/15/2020   HGB 15.1 12/15/2020   HCT 46.1 12/15/2020   MCV 95.1 12/15/2020   PLT 169 12/15/2020   Lab Results  Component Value Date   CREATININE 0.97 12/15/2020   BUN 19 12/15/2020   NA 137 12/15/2020   K 3.8 12/15/2020   CL 105 12/15/2020   CO2 22 12/15/2020   Lab  Results  Component Value Date   ALT 72 (H) 12/15/2020   AST 68 (H) 12/15/2020   ALKPHOS 60 12/15/2020   BILITOT 0.8 12/15/2020   Lab Results  Component Value Date   CHOL 166 01/17/2020   HDL 35 (L) 01/17/2020   LDLCALC 110 (H) 01/17/2020   TRIG 117 01/17/2020   CHOLHDL 4.7 01/17/2020    No results found for: "HGBA1C"  Assessment & Plan       1.  Essential hypertension: -Patient's blood pressures were elevated during follow-up and Hyzaar increased. -Today patient's blood pressures  was well controlled at 116/72 -He denies any adverse reactions with increased dosage and we will check a BMET today  2.  Hypercholesteremia: Patient's Crestor was increased to 10 mg daily during last visit -Patient encouraged to increase physical activity as tolerated and reduce consumption of processed foods -He denies any myalgias and we will check LFTs and lipids today   3.  History of premature CAD: -Patient has risk factors of hyperlipidemia and hypertension along with obesity. -We discussed the importance of taking all medications as prescribed in order to prevent progression of heart disease   4.  Obesity: -Current BMI is 41.97 kg/m  -Consider adding Ozempic and walking program -I discussed the PREP program at the [facility] and patient politely declines at this time.  Disposition: Follow-up with Belva Crome III, MD or APP in 6 months    Medication Adjustments/Labs and Tests Ordered: Current medicines are reviewed at length with the patient today.  Concerns regarding medicines are outlined above.   Signed, Mable Fill, Marissa Nestle, NP 02/01/2022, 10:38 AM [facility]  Note:  This document was prepared using Dragon voice recognition software and may include unintentional dictation errors.

## 2022-02-02 ENCOUNTER — Ambulatory Visit: Payer: 59 | Attending: Nurse Practitioner | Admitting: Nurse Practitioner

## 2022-02-02 ENCOUNTER — Encounter: Payer: Self-pay | Admitting: Nurse Practitioner

## 2022-02-02 VITALS — BP 116/72 | HR 78 | Ht 66.0 in | Wt 282.2 lb

## 2022-02-02 DIAGNOSIS — Z8249 Family history of ischemic heart disease and other diseases of the circulatory system: Secondary | ICD-10-CM | POA: Diagnosis not present

## 2022-02-02 DIAGNOSIS — I10 Essential (primary) hypertension: Secondary | ICD-10-CM

## 2022-02-02 DIAGNOSIS — E7800 Pure hypercholesterolemia, unspecified: Secondary | ICD-10-CM

## 2022-02-02 DIAGNOSIS — E663 Overweight: Secondary | ICD-10-CM | POA: Diagnosis not present

## 2022-02-02 NOTE — Patient Instructions (Signed)
Medication Instructions:  Your physician recommends that you continue on your current medications as directed. Please refer to the Current Medication list given to you today.  *If you need a refill on your cardiac medications before your next appointment, please call your pharmacy*   Lab Work: BMET, LFT, Lipids  If you have labs (blood work) drawn today and your tests are completely normal, you will receive your results only by: [place] (if you have MyChart) OR A paper copy in the mail If you have any lab test that is abnormal or we need to change your treatment, we will call you to review the results.    Follow-Up: At [facility], you and your health needs are our priority.  As part of our continuing mission to provide you with exceptional heart care, we have created designated Provider Care Teams.  These Care Teams include your primary Cardiologist (physician) and Advanced Practice Providers (APPs -  Physician Assistants and Nurse Practitioners) who all work together to provide you with the care you need, when you need it.  We recommend signing up for the patient portal called "MyChart".  Sign up information is provided on this After Visit Summary.  MyChart is used to connect with patients for Virtual Visits (Telemedicine).  Patients are able to view lab/test results, encounter notes, upcoming appointments, etc.  Non-urgent messages can be sent to your provider as well.   To learn more about what you can do with MyChart, go to [email or web address].    Your next appointment:   6 month(s)  The format for your next appointment:   In Person  Provider:   Ambrose Pancoast, NP

## 2022-02-03 ENCOUNTER — Telehealth: Payer: Self-pay

## 2022-02-03 DIAGNOSIS — E7800 Pure hypercholesterolemia, unspecified: Secondary | ICD-10-CM

## 2022-02-03 LAB — HEPATIC FUNCTION PANEL
ALT: 43 IU/L (ref 0–44)
AST: 43 IU/L — ABNORMAL HIGH (ref 0–40)
Albumin: 4.4 g/dL (ref 3.9–4.9)
Alkaline Phosphatase: 88 IU/L (ref 44–121)
Bilirubin Total: 0.8 mg/dL (ref 0.0–1.2)
Bilirubin, Direct: 0.17 mg/dL (ref 0.00–0.40)
Total Protein: 7.1 g/dL (ref 6.0–8.5)

## 2022-02-03 LAB — LIPID PANEL
Chol/HDL Ratio: 4.8 ratio (ref 0.0–5.0)
Cholesterol, Total: 171 mg/dL (ref 100–199)
HDL: 36 mg/dL — ABNORMAL LOW (ref >39)
LDL Chol Calc (NIH): 114 mg/dL — ABNORMAL HIGH (ref 0–99)
Triglycerides: 117 mg/dL (ref 0–149)
VLDL Cholesterol Cal: 21 mg/dL (ref 5–40)

## 2022-02-03 LAB — BASIC METABOLIC PANEL
BUN/Creatinine Ratio: 13 (ref 10–24)
BUN: 14 mg/dL (ref 8–27)
CO2: 23 mmol/L (ref 20–29)
Calcium: 9.8 mg/dL (ref 8.6–10.2)
Chloride: 100 mmol/L (ref 96–106)
Creatinine, Ser: 1.08 mg/dL (ref 0.76–1.27)
Glucose: 152 mg/dL — ABNORMAL HIGH (ref 70–99)
Potassium: 4.1 mmol/L (ref 3.5–5.2)
Sodium: 138 mmol/L (ref 134–144)
eGFR: 78 mL/min/{1.73_m2} (ref >59)

## 2022-02-03 MED ORDER — ROSUVASTATIN CALCIUM 20 MG PO TABS: 20.0000 mg | ORAL_TABLET | Freq: Every day | ORAL | 3 refills | Status: AC

## 2022-02-03 NOTE — Telephone Encounter (Signed)
The patient has been notified of the result and verbalized understanding.  All questions (if any) were answered. Tor Netters, RN 02/03/2022 5:10 PM

## 2022-02-25 ENCOUNTER — Ambulatory Visit: Payer: 59 | Attending: Nurse Practitioner

## 2022-02-25 DIAGNOSIS — I10 Essential (primary) hypertension: Secondary | ICD-10-CM

## 2022-02-25 DIAGNOSIS — R0789 Other chest pain: Secondary | ICD-10-CM

## 2022-02-26 LAB — LIPID PANEL
Chol/HDL Ratio: 4.3 ratio (ref 0.0–5.0)
Cholesterol, Total: 156 mg/dL (ref 100–199)
HDL: 36 mg/dL — ABNORMAL LOW
LDL Chol Calc (NIH): 103 mg/dL — ABNORMAL HIGH (ref 0–99)
Triglycerides: 87 mg/dL (ref 0–149)
VLDL Cholesterol Cal: 17 mg/dL (ref 5–40)

## 2022-02-26 LAB — HEPATIC FUNCTION PANEL
ALT: 44 [IU]/L (ref 0–44)
AST: 37 [IU]/L (ref 0–40)
Albumin: 4.5 g/dL (ref 3.9–4.9)
Alkaline Phosphatase: 73 [IU]/L (ref 44–121)
Bilirubin Total: 0.4 mg/dL (ref 0.0–1.2)
Bilirubin, Direct: 0.16 mg/dL (ref 0.00–0.40)
Total Protein: 7.1 g/dL (ref 6.0–8.5)

## 2022-03-17 ENCOUNTER — Other Ambulatory Visit: Payer: 59

## 2022-08-29 ENCOUNTER — Ambulatory Visit: Payer: 59 | Attending: Nurse Practitioner

## 2022-08-30 ENCOUNTER — Other Ambulatory Visit: Payer: Self-pay

## 2022-08-30 DIAGNOSIS — E7800 Pure hypercholesterolemia, unspecified: Secondary | ICD-10-CM

## 2022-08-30 LAB — HEPATIC FUNCTION PANEL
ALT: 53 IU/L — ABNORMAL HIGH (ref 0–44)
AST: 44 IU/L — ABNORMAL HIGH (ref 0–40)
Albumin: 4.1 g/dL (ref 3.9–4.9)
Alkaline Phosphatase: 60 IU/L (ref 44–121)
Bilirubin Total: 0.5 mg/dL (ref 0.0–1.2)
Bilirubin, Direct: 0.16 mg/dL (ref 0.00–0.40)
Total Protein: 6.8 g/dL (ref 6.0–8.5)

## 2022-08-30 LAB — LIPID PANEL
Chol/HDL Ratio: 4.7 ratio (ref 0.0–5.0)
Cholesterol, Total: 174 mg/dL (ref 100–199)
HDL: 37 mg/dL — ABNORMAL LOW (ref >39)
LDL Chol Calc (NIH): 116 mg/dL — ABNORMAL HIGH (ref 0–99)
Triglycerides: 116 mg/dL (ref 0–149)
VLDL Cholesterol Cal: 21 mg/dL (ref 5–40)

## 2022-08-30 MED ORDER — EZETIMIBE 10 MG PO TABS: 10.0000 mg | ORAL_TABLET | Freq: Every day | ORAL | 3 refills | Status: DC

## 2022-10-26 ENCOUNTER — Ambulatory Visit: Payer: 59

## 2022-12-20 ENCOUNTER — Other Ambulatory Visit: Payer: Self-pay | Admitting: Nurse Practitioner

## 2023-03-01 NOTE — Progress Notes (Unsigned)
Cardiology Office Note    Patient Name: Michael Soto Date of Encounter: 03/02/2023  Primary Care Provider:  Renford Dills, MD Primary Cardiologist:  Lesleigh Noe, MD (Inactive) Primary Electrophysiologist: None   Past Medical History    Past Medical History:  Diagnosis Date   Acute pain of left shoulder due to trauma    Candidiasis of penis 08/25/2011   Chest discomfort 03/28/2017   Complication of anesthesia    slow to move knees after surgery   Diabetes mellitus without complication ([facility])    Essential hypertension 03/28/2017   External hemorrhoid 06/10/2011   History of kidney stones 2013   at least 2   Male genital ulcer 08/25/2011   Prediabetes    Pure hypercholesterolemia     History of Present Illness  Michael Soto is a 62 y.o. male with PMH of HTN, DM type II, hyperlipidemia, significant family history of premature CAD presents today for 1 year follow-up.  Michael Soto was last seen on 02/02/2022 for 1 month follow-up.  During his previous visit patient's blood pressure was elevated and adjustments were made with improvement to BP at follow-up.  He also had adjustments to his Crestor however LDL was still not at goal and ezetimibe was added to current regimen.  Patient was also advised to increase physical activity and was advised to increase exercise 250 minutes/week.  Michael Soto presents today for 1 year follow-up with concerns about chest pain and fatigue. The patient attributes these symptoms to stress and physical exertion at work, where they perform repetitive motions and handle heavy containers.  I was able to reproduce his pain on palpation and patient denied any recurrence of pain with physical exertion.  The patient also mentions knee pain due to a previous ACL injury. The patient's weight has decreased since the last visit, which they attribute to stress and changes in diet. The patient's cholesterol medication was adjusted during the last visit, and the patient  is unsure if they have been taking the new medication, ezetimibe.  He is continuing to care for his family and therefore is not able to dissipate in a structured exercise program. During today's visit we discussed the importance of primary prevention and the role of diet and exercise in managing heart disease.   Patient denies chest pain, palpitations, dyspnea, PND, orthopnea, nausea, vomiting, dizziness, syncope, edema, weight gain, or early satiety.  Review of Systems  Please see the history of present illness.    All other systems reviewed and are otherwise negative except as noted above.  Physical Exam    Wt Readings from Last 3 Encounters:  03/02/23 271 lb (122.9 kg)  02/02/22 282 lb 3.2 oz (128 kg)  12/31/21 281 lb 3.2 oz (127.6 kg)   VS: Vitals:   03/02/23 1340  BP: 122/74  Pulse: 77  SpO2: 97%  ,Body mass index is 43.74 kg/m. GEN: Well nourished, well developed in no acute distress Neck: No JVD; No carotid bruits Pulmonary: Clear to auscultation without rales, wheezing or rhonchi  Cardiovascular: Normal rate. Regular rhythm. Normal S1. Normal S2.   Murmurs: There is no murmur.  ABDOMEN: Soft, non-tender, non-distended EXTREMITIES:  No edema; No deformity   EKG/LABS/ Recent Cardiac Studies   ECG personally reviewed by me today -sinus rhythm with rate of 77 bpm and no acute changes consistent with previous EKG.  Risk Assessment/Calculations:          Lab Results  Component Value Date   WBC 11.4 (  H) 12/15/2020   HGB 15.1 12/15/2020   HCT 46.1 12/15/2020   MCV 95.1 12/15/2020   PLT 169 12/15/2020   Lab Results  Component Value Date   CREATININE 1.08 02/02/2022   BUN 14 02/02/2022   NA 138 02/02/2022   K 4.1 02/02/2022   CL 100 02/02/2022   CO2 23 02/02/2022   Lab Results  Component Value Date   CHOL 174 08/29/2022   HDL 37 (L) 08/29/2022   LDLCALC 116 (H) 08/29/2022   TRIG 116 08/29/2022   CHOLHDL 4.7 08/29/2022    No results found for:  "HGBA1C" Assessment & Plan    1.  Chest Pain: Reports of sharp, localized chest pain, reproducible on palpation, likely musculoskeletal due to repetitive work activity. No associated shortness of breath or radiation of pain. Normal EKG and no distress on physical activity. -Order calcium score for further cardiac risk stratification. -Continue to monitor symptoms and report any changes.  2.  Hypercholesteremia: Patient's previous LDL cholesterol was not at goal and Crestor increased to 10 mg and ezetimibe 10 mg added -We will recheck LFTs and lipids today   3.  History of premature CAD: -Patient has risk factors of hyperlipidemia and hypertension along with obesity. -We discussed the importance of taking all medications as prescribed in order to prevent progression of heart disease   4.  Obesity: -Current BMI is 41.97 kg/m  -Consider adding Ozempic and walking program -I discussed the PREP program at the [facility] and patient politely declines at this time.  5.  Essential hypertension: Patient's blood pressure today was well-controlled today at 122/74 -Continue Hyzaar 100/25 mg daily  Disposition: Follow-up with Lesleigh Noe, MD (Inactive) or APP in 6 months   Signed, Napoleon Form, Leodis Rains, NP 03/02/2023, 2:07 PM [facility]

## 2023-03-02 ENCOUNTER — Ambulatory Visit: Payer: 59 | Attending: Nurse Practitioner | Admitting: Nurse Practitioner

## 2023-03-02 ENCOUNTER — Encounter: Payer: Self-pay | Admitting: Nurse Practitioner

## 2023-03-02 VITALS — BP 122/74 | HR 77 | Ht 66.0 in | Wt 271.0 lb

## 2023-03-02 DIAGNOSIS — E663 Overweight: Secondary | ICD-10-CM

## 2023-03-02 DIAGNOSIS — I10 Essential (primary) hypertension: Secondary | ICD-10-CM | POA: Diagnosis not present

## 2023-03-02 DIAGNOSIS — Z8249 Family history of ischemic heart disease and other diseases of the circulatory system: Secondary | ICD-10-CM

## 2023-03-02 DIAGNOSIS — E7800 Pure hypercholesterolemia, unspecified: Secondary | ICD-10-CM | POA: Diagnosis not present

## 2023-03-02 NOTE — Patient Instructions (Addendum)
Medication Instructions:  Your physician recommends that you continue on your current medications as directed. Please refer to the Current Medication list given to you today. *If you need a refill on your cardiac medications before your next appointment, please call your pharmacy*   Lab Work: TODAY-CMET & LIPIDS If you have labs (blood work) drawn today and your tests are completely normal, you will receive your results only by: MyChart Message (if you have MyChart) OR A paper copy in the mail If you have any lab test that is abnormal or we need to change your treatment, we will call you to review the results.   Testing/Procedures: CARDIAC CT W/CALCIUM SCORE $99 test   Follow-Up: At [facility], you and your health needs are our priority.  As part of our continuing mission to provide you with exceptional heart care, we have created designated Provider Care Teams.  These Care Teams include your primary Cardiologist (physician) and Advanced Practice Providers (APPs -  Physician Assistants and Nurse Practitioners) who all work together to provide you with the care you need, when you need it.  We recommend signing up for the patient portal called "MyChart".  Sign up information is provided on this After Visit Summary.  MyChart is used to connect with patients for Virtual Visits (Telemedicine).  Patients are able to view lab/test results, encounter notes, upcoming appointments, etc.  Non-urgent messages can be sent to your provider as well.   To learn more about what you can do with MyChart, go to [email or web address].    Your next appointment:   6 month(s)  Provider:   Donato Schultz, MD     Other Instructions

## 2023-03-03 LAB — COMPREHENSIVE METABOLIC PANEL
ALT: 49 [IU]/L — ABNORMAL HIGH (ref 0–44)
AST: 48 [IU]/L — ABNORMAL HIGH (ref 0–40)
Albumin: 4.5 g/dL (ref 3.9–4.9)
Alkaline Phosphatase: 76 [IU]/L (ref 44–121)
BUN/Creatinine Ratio: 15 (ref 10–24)
BUN: 14 mg/dL (ref 8–27)
Bilirubin Total: 0.8 mg/dL (ref 0.0–1.2)
CO2: 21 mmol/L (ref 20–29)
Calcium: 9.5 mg/dL (ref 8.6–10.2)
Chloride: 99 mmol/L (ref 96–106)
Creatinine, Ser: 0.96 mg/dL (ref 0.76–1.27)
Globulin, Total: 2.7 g/dL (ref 1.5–4.5)
Glucose: 153 mg/dL — ABNORMAL HIGH (ref 70–99)
Potassium: 4 mmol/L (ref 3.5–5.2)
Sodium: 136 mmol/L (ref 134–144)
Total Protein: 7.2 g/dL (ref 6.0–8.5)
eGFR: 89 mL/min/{1.73_m2} (ref >59)

## 2023-03-03 LAB — LIPID PANEL
Chol/HDL Ratio: 5.6 ratio — ABNORMAL HIGH (ref 0.0–5.0)
Cholesterol, Total: 190 mg/dL (ref 100–199)
HDL: 34 mg/dL — ABNORMAL LOW (ref >39)
LDL Chol Calc (NIH): 133 mg/dL — ABNORMAL HIGH (ref 0–99)
Triglycerides: 125 mg/dL (ref 0–149)
VLDL Cholesterol Cal: 23 mg/dL (ref 5–40)

## 2023-03-07 ENCOUNTER — Other Ambulatory Visit: Payer: Self-pay

## 2023-03-07 DIAGNOSIS — E7800 Pure hypercholesterolemia, unspecified: Secondary | ICD-10-CM

## 2023-04-06 ENCOUNTER — Ambulatory Visit (HOSPITAL_COMMUNITY)
Admission: RE | Admit: 2023-04-06 | Discharge: 2023-04-06 | Disposition: A | Discharge status: Home / Self Care | Payer: Self-pay | Source: Ambulatory Visit | Attending: Nurse Practitioner | Admitting: Nurse Practitioner

## 2023-04-06 DIAGNOSIS — E663 Overweight: Secondary | ICD-10-CM | POA: Insufficient documentation

## 2023-04-06 DIAGNOSIS — E7800 Pure hypercholesterolemia, unspecified: Secondary | ICD-10-CM | POA: Insufficient documentation

## 2023-04-06 DIAGNOSIS — I10 Essential (primary) hypertension: Secondary | ICD-10-CM | POA: Insufficient documentation

## 2023-04-06 DIAGNOSIS — Z8249 Family history of ischemic heart disease and other diseases of the circulatory system: Secondary | ICD-10-CM | POA: Insufficient documentation

## 2023-04-10 ENCOUNTER — Other Ambulatory Visit: Payer: Self-pay | Admitting: *Deleted

## 2023-04-10 MED ORDER — EZETIMIBE 10 MG PO TABS: 10.0000 mg | ORAL_TABLET | Freq: Every day | ORAL | 3 refills | Status: AC

## 2023-04-16 NOTE — Progress Notes (Signed)
 Cardiology Office Note    Patient Name: Michael Soto Date of Encounter: 04/16/2023  Primary Care Provider:  Rexanne Ingle, MD Primary Cardiologist:  Victory LELON Claudene DOUGLAS, MD (Inactive) Primary Electrophysiologist: None   Past Medical History    Past Medical History:  Diagnosis Date   Acute pain of left shoulder due to trauma    Candidiasis of penis 08/25/2011   Chest discomfort 03/28/2017   Complication of anesthesia    slow to move knees after surgery   Diabetes mellitus without complication ([facility])    Essential hypertension 03/28/2017   External hemorrhoid 06/10/2011   History of kidney stones 2013   at least 2   Male genital ulcer 08/25/2011   Prediabetes    Pure hypercholesterolemia     History of Present Illness  Michael Soto is a 63 y.o. male with PMH of coronary artery calcifications, HTN, DM type II, hyperlipidemia, significant family history of premature CAD who presents today for discussion of abnormal coronary CT.  Michael Soto was last seen on 03/02/2023 for annual follow-up.  During visit patient reported complaints of chest pain and fatigue which he attributes to increased stress and physical work exertion.  I was able to reproduce discomfort on palpation during physical exam.  He had previous lipids completed with elevation of LDL and was placed on ezetimibe  which patient reported inconsistencies with compliance.  Due to his family history of premature CAD and nature of discomfort he was sent for coronary calcium  score which showed aortic atherosclerosis with calcium  score of 2416 with significant buildup in RCA and patient was placed on Crestor  20 mg with ezetimibe  10 mg added.  Michael Soto presents today for follow-up and to discuss recent coronary calcium  score results.  He continues to report left shoulder pain but denies any reproduction with activity.  Discomfort is able to be reproduced with palpation. The patient has been managing the pain by sleeping on the opposite  side. The patient reports a change in diet, with a reduction in fast food consumption and an increase in salad intake. However, the patient acknowledges a lack of physical activity due to a busy schedule and the responsibility of caring for their parents. The patient also wears a knee brace due to a missing ACL, which may contribute to their limited physical activity.  Patient denies chest pain, palpitations, dyspnea, PND, orthopnea, nausea, vomiting, dizziness, syncope, edema, weight gain, or early satiety.   Review of Systems  Please see the history of present illness.    All other systems reviewed and are otherwise negative except as noted above.  Physical Exam    Wt Readings from Last 3 Encounters:  03/02/23 271 lb (122.9 kg)  02/02/22 282 lb 3.2 oz (128 kg)  12/31/21 281 lb 3.2 oz (127.6 kg)   CD:Uyzmz were no vitals filed for this visit.,There is no height or weight on file to calculate BMI. GEN: Well nourished, well developed in no acute distress Neck: No JVD; No carotid bruits Pulmonary: Clear to auscultation without rales, wheezing or rhonchi  Cardiovascular: Normal rate. Regular rhythm. Normal S1. Normal S2.   Murmurs: There is no murmur.  ABDOMEN: Soft, non-tender, non-distended EXTREMITIES:  No edema; No deformity   EKG/LABS/ Recent Cardiac Studies   ECG personally reviewed by me today -none completed today  Risk Assessment/Calculations:          Lab Results  Component Value Date   WBC 11.4 (H) 12/15/2020   HGB 15.1 12/15/2020   HCT  46.1 12/15/2020   MCV 95.1 12/15/2020   PLT 169 12/15/2020   Lab Results  Component Value Date   CREATININE 0.96 03/02/2023   BUN 14 03/02/2023   NA 136 03/02/2023   K 4.0 03/02/2023   CL 99 03/02/2023   CO2 21 03/02/2023   Lab Results  Component Value Date   CHOL 190 03/02/2023   HDL 34 (L) 03/02/2023   LDLCALC 133 (H) 03/02/2023   TRIG 125 03/02/2023   CHOLHDL 5.6 (H) 03/02/2023    No results found for:  HGBA1C Assessment & Plan    1.  Coronary artery disease: -High burden of coronary artery calcium , particularly in the right coronary artery. Chest pain present, but unclear if it is cardiac in origin. Discussed the need for further evaluation to assess for flow-limiting disease. -Order PET stress test to evaluate for ischemic heart disease. -Order echocardiogram to assess cardiac function and evaluate aortic valve for stenosis.  2.  Essential hypertension: Patient's blood pressure today was mildly levated at 140/80 -Continue Hyzaar 100-25 mg  3.  Hypercholesteremia: -Discussed the importance of medication compliance to halt progression of coronary artery disease. -Encouraged patient to pick up and start Ezetimibe  as prescribed.  4.  Morbid obesity: -Patient's BMI is 44.22 kg/m -Lifestyle modification stressed along with encouragement to increase physical activity as tolerated  Disposition: Follow-up with Dr. Jeffrie or APP in 3 months   Signed, Wyn Raddle, Jackee Shove, NP 04/16/2023, 1:54 PM [facility]

## 2023-04-17 ENCOUNTER — Encounter: Payer: Self-pay | Admitting: Nurse Practitioner

## 2023-04-17 ENCOUNTER — Ambulatory Visit: Payer: 59 | Attending: Nurse Practitioner | Admitting: Nurse Practitioner

## 2023-04-17 VITALS — BP 140/80 | HR 82 | Ht 66.0 in | Wt 274.0 lb

## 2023-04-17 DIAGNOSIS — Z8249 Family history of ischemic heart disease and other diseases of the circulatory system: Secondary | ICD-10-CM

## 2023-04-17 DIAGNOSIS — I10 Essential (primary) hypertension: Secondary | ICD-10-CM | POA: Diagnosis not present

## 2023-04-17 DIAGNOSIS — I2510 Atherosclerotic heart disease of native coronary artery without angina pectoris: Secondary | ICD-10-CM | POA: Diagnosis not present

## 2023-04-17 DIAGNOSIS — R079 Chest pain, unspecified: Secondary | ICD-10-CM

## 2023-04-17 DIAGNOSIS — I350 Nonrheumatic aortic (valve) stenosis: Secondary | ICD-10-CM

## 2023-04-17 DIAGNOSIS — E7800 Pure hypercholesterolemia, unspecified: Secondary | ICD-10-CM | POA: Diagnosis not present

## 2023-04-17 DIAGNOSIS — I200 Unstable angina: Secondary | ICD-10-CM

## 2023-04-17 NOTE — Patient Instructions (Signed)
 Medication Instructions:  Your physician recommends that you continue on your current medications as directed. Please refer to the Current Medication list given to you today.  *If you need a refill on your cardiac medications before your next appointment, please call your pharmacy*   Lab Work: None ordered  If you have labs (blood work) drawn today and your tests are completely normal, you will receive your results only by: MyChart Message (if you have MyChart) OR A paper copy in the mail If you have any lab test that is abnormal or we need to change your treatment, we will call you to review the results.   Testing/Procedures: Your physician has requested that you have an echocardiogram. Echocardiography is a painless test that uses sound waves to create images of your heart. It provides your doctor with information about the size and shape of your heart and how well your heart's chambers and valves are working. This procedure takes approximately one hour. There are no restrictions for this procedure. Please do NOT wear cologne, perfume, aftershave, or lotions (deodorant is allowed). Please arrive 15 minutes prior to your appointment time.  Please note: We ask at that you not bring children with you during ultrasound (echo/ vascular) testing. Due to room size and safety concerns, children are not allowed in the ultrasound rooms during exams. Our front office staff cannot provide observation of children in our lobby area while testing is being conducted. An adult accompanying a patient to their appointment will only be allowed in the ultrasound room at the discretion of the ultrasound technician under special circumstances. We apologize for any inconvenience.      Please report to Radiology at the [facility] Main Entrance 30 minutes early for your test.  [address] [place], [place] [address]                         OR   Please report to Radiology at [facility] Main Entrance, medical mall, 30 mins prior to your test.  [address]  [place], [place]  [phone]  How to Prepare for Your Cardiac PET/CT Stress Test:  Nothing to eat or drink, except water, 3 hours prior to arrival time.  NO caffeine/decaffeinated products, or chocolate 12 hours prior to arrival. (Please note decaffeinated beverages (teas/coffees) still contain caffeine).  If you have caffeine within 12 hours prior, the test will need to be rescheduled.  Medication instructions: Do not take erectile dysfunction medications for 72 hours prior to test (sildenafil, tadalafil) Do not take nitrates (isosorbide mononitrate, Ranexa) the day before or day of test Do not take tamsulosin the day before or morning of test Hold theophylline containing medications for 12 hours. Hold Dipyridamole 48 hours prior to the test.  Diabetic Preparation: If able to eat breakfast prior to 3 hour fasting, you may take all medications, including your insulin. Do not worry if you miss your breakfast dose of insulin - start at your next meal. If you do not eat prior to 3 hour fast-Hold all diabetes (oral and insulin) medications. Patients who wear a continuous glucose monitor MUST remove the device prior to scanning.  You may take your remaining medications with water.  NO perfume, cologne or lotion on chest or abdomen area. FEMALES - Please avoid wearing dresses to this appointment.  Total time is 1 to 2 hours; you may want to bring reading material for the waiting time.  IF  YOU THINK YOU MAY BE PREGNANT, OR ARE NURSING PLEASE INFORM THE TECHNOLOGIST.  In preparation for your appointment, medication and supplies will be purchased.  Appointment availability is limited, so if you need to cancel or reschedule, please call the Radiology Department at [phone] [facility] Law) OR [phone] [facility]) 24 hours in advance to avoid a cancellation fee of $100.00  What to Expect When you  Arrive:  Once you arrive and check in for your appointment, you will be taken to a preparation room within the Radiology Department.  A technologist or Nurse will obtain your medical history, verify that you are correctly prepped for the exam, and explain the procedure.  Afterwards, an IV will be started in your arm and electrodes will be placed on your skin for EKG monitoring during the stress portion of the exam. Then you will be escorted to the PET/CT scanner.  There, staff will get you positioned on the scanner and obtain a blood pressure and EKG.  During the exam, you will continue to be connected to the EKG and blood pressure machines.  A small, safe amount of a radioactive tracer will be injected in your IV to obtain a series of pictures of your heart along with an injection of a stress agent.    After your Exam:  It is recommended that you eat a meal and drink a caffeinated beverage to counter act any effects of the stress agent.  Drink plenty of fluids for the remainder of the day and urinate frequently for the first couple of hours after the exam.  Your doctor will inform you of your test results within 7-10 business days.  For more information and frequently asked questions, please visit our website: [email or web address]  For questions about your test or how to prepare for your test, please call: Cardiac Imaging Nurse Navigators Office: [phone]    Follow-Up: At [facility], you and your health needs are our priority.  As part of our continuing mission to provide you with exceptional heart care, we have created designated Provider Care Teams.  These Care Teams include your primary Cardiologist (physician) and Advanced Practice Providers (APPs -  Physician Assistants and Nurse Practitioners) who all work together to provide you with the care you need, when you need it.  We recommend signing up for the patient portal called MyChart.  Sign up information is  provided on this After Visit Summary.  MyChart is used to connect with patients for Virtual Visits (Telemedicine).  Patients are able to view lab/test results, encounter notes, upcoming appointments, etc.  Non-urgent messages can be sent to your provider as well.   To learn more about what you can do with MyChart, go to [email or web address].    Your next appointment:   3 month(s)  Provider:   Oneil Parchment, MD     Other Instructions

## 2023-05-05 ENCOUNTER — Ambulatory Visit: Payer: 59

## 2023-05-05 NOTE — Progress Notes (Deleted)
Patient ID: [name]                 DOB: 03/09/1961                      MRN: [id number]      HPI: Michael Soto is a 63 y.o. male referred by Michael Soto to HTN clinic. PMH is significant for coronary artery calcifications, HTN, DM type II, hyperlipidemia, significant family history of premature CAD.  Due to his family history of premature CAD and nature of discomfort he was sent for coronary calcium score which showed aortic atherosclerosis with calcium score of 2416 with significant buildup in RCA and patient was placed on Crestor 20 mg with ezetimibe 10 mg added. He was seen by Michael Soto 04/17/23 to review CAC results. BP was 140/80 in clinic. Patient reported trying to make some lifestyle changes such as less fast food and more salads but admitted to limited physical activity. PET scan was ordered, patient was encouraged to pick up ezetimibe. No changes were made in blood pressure medications.   Med compliance? Exercise? Home bp? Repeat lipid labs?   Current HTN meds: losartan/hydrochlorothiazide 100/25mg  daily  Current Lipid meds: rosuvastatin 20mg  daily, ezetimibe 10mg  daily Previously tried:  BP goal:   Family History:   Social History:   Diet:   Exercise:  {types:28256}  Home BP readings:  Date SBP/DBP  HR                              Average      Wt Readings from Last 3 Encounters:  04/17/23 274 lb (124.3 kg)  03/02/23 271 lb (122.9 kg)  02/02/22 282 lb 3.2 oz (128 kg)   BP Readings from Last 3 Encounters:  04/17/23 (!) 140/80  03/02/23 122/74  02/02/22 116/72   Pulse Readings from Last 3 Encounters:  04/17/23 82  03/02/23 77  02/02/22 78    Renal function: CrCl cannot be calculated (Patient's most recent lab result is older than the maximum 21 days allowed.).  Past Medical History:  Diagnosis Date   Acute pain of left shoulder due to trauma    Candidiasis of penis 08/25/2011   Chest discomfort 03/28/2017   Complication of anesthesia    slow  to move knees after surgery   Diabetes mellitus without complication (HCC)    Essential hypertension 03/28/2017   External hemorrhoid 06/10/2011   History of kidney stones 2013   at least 2   Male genital ulcer 08/25/2011   Prediabetes    Pure hypercholesterolemia     Current Outpatient Medications on File Prior to Visit  Medication Sig Dispense Refill   aspirin EC 81 MG tablet Take 81 mg by mouth 3 (three) times a week. Swallow whole.     ezetimibe (ZETIA) 10 MG tablet Take 1 tablet (10 mg total) by mouth daily. 90 tablet 3   losartan-hydrochlorothiazide (HYZAAR) 100-25 MG tablet TAKE 1 TABLET BY MOUTH DAILY 90 tablet 0   metFORMIN (GLUCOPHAGE) 500 MG tablet Take 500 mg by mouth daily.     Multiple Vitamins-Minerals (ZINC PO) Take by mouth.     OVER THE COUNTER MEDICATION Take 1 capsule by mouth daily. VITAMIN C, D AND ZINC COMBINATION     rosuvastatin (CRESTOR) 20 MG tablet Take 1 tablet (20 mg total) by mouth daily. 90 tablet 3   Turmeric 450 MG CAPS Take by mouth.  vitamin D3 (CHOLECALCIFEROL) 25 MCG tablet Take by mouth.     No current facility-administered medications on file prior to visit.    No Known Allergies  There were no vitals taken for this visit.   Assessment/Plan: No BP recorded.  {Refresh Note OR Click here to enter BP  :1}***   1. Hypertension -  No problem-specific Assessment & Plan notes found for this encounter.      Thank you  Michael Soto, Pharm.D, BCACP, CPP  [facility] [address] [place], [place] [address]  Phone: [phone]; Fax: [phone]

## 2023-05-15 ENCOUNTER — Ambulatory Visit (HOSPITAL_COMMUNITY): Payer: 59 | Attending: Cardiology

## 2023-05-15 DIAGNOSIS — I350 Nonrheumatic aortic (valve) stenosis: Secondary | ICD-10-CM | POA: Insufficient documentation

## 2023-05-15 DIAGNOSIS — R079 Chest pain, unspecified: Secondary | ICD-10-CM | POA: Insufficient documentation

## 2023-05-15 DIAGNOSIS — I10 Essential (primary) hypertension: Secondary | ICD-10-CM | POA: Diagnosis present

## 2023-05-15 DIAGNOSIS — Z8249 Family history of ischemic heart disease and other diseases of the circulatory system: Secondary | ICD-10-CM | POA: Insufficient documentation

## 2023-05-15 DIAGNOSIS — E7800 Pure hypercholesterolemia, unspecified: Secondary | ICD-10-CM | POA: Diagnosis present

## 2023-05-15 DIAGNOSIS — I2510 Atherosclerotic heart disease of native coronary artery without angina pectoris: Secondary | ICD-10-CM | POA: Insufficient documentation

## 2023-05-16 LAB — ECHOCARDIOGRAM COMPLETE
Area-P 1/2: 4.31 cm2
S' Lateral: 3 cm

## 2023-05-17 ENCOUNTER — Telehealth (HOSPITAL_BASED_OUTPATIENT_CLINIC_OR_DEPARTMENT_OTHER): Payer: Self-pay | Admitting: *Deleted

## 2023-05-17 NOTE — Telephone Encounter (Signed)
   Pre-operative Risk Assessment    Patient Name: Michael Soto  DOB: 23-Nov-1960 MRN: [id number]   Date of last office visit: 04/17/2023 Date of next office visit: 07/21/2023  Request for Surgical Clearance    Procedure:   Right  Shoulder scopy,rotator cuff repair  Date of Surgery:  Clearance TBD                                 Surgeon:  Dr. Bonner Hair Surgeon's Group or Practice Name:  [facility] Phone number:  [phone] Fax number:  [phone]   Type of Clearance Requested:   - Medical  - Pharmacy:  Hold Aspirin  Not indicated   Type of Anesthesia:  General Interscalene block   Additional requests/questions:    Signed, Edsel Grayce Sanders   05/17/2023, 9:55 AM

## 2023-06-05 ENCOUNTER — Encounter (HOSPITAL_COMMUNITY): Payer: Self-pay

## 2023-06-05 ENCOUNTER — Telehealth (HOSPITAL_COMMUNITY): Payer: Self-pay | Admitting: Emergency Medicine

## 2023-06-05 NOTE — Addendum Note (Signed)
 Addended by: Elizabeth Palau on: 06/05/2023 03:18 PM   Modules accepted: Orders

## 2023-06-06 ENCOUNTER — Telehealth (HOSPITAL_COMMUNITY): Payer: Self-pay | Admitting: Emergency Medicine

## 2023-06-06 NOTE — Telephone Encounter (Signed)
Attempted to call patient regarding upcoming cardiac PET appointment. Left message on voicemail with name and callback number Aidan Moten RN Navigator Cardiac Imaging [facility] [phone] Office [phone] Cell  

## 2023-06-07 ENCOUNTER — Encounter (HOSPITAL_COMMUNITY)
Admission: RE | Admit: 2023-06-07 | Discharge: 2023-06-07 | Disposition: A | Discharge status: Home / Self Care | Payer: 59 | Source: Ambulatory Visit | Attending: Nurse Practitioner | Admitting: Nurse Practitioner

## 2023-06-07 DIAGNOSIS — R079 Chest pain, unspecified: Secondary | ICD-10-CM | POA: Insufficient documentation

## 2023-06-07 DIAGNOSIS — E7800 Pure hypercholesterolemia, unspecified: Secondary | ICD-10-CM | POA: Insufficient documentation

## 2023-06-07 DIAGNOSIS — I2510 Atherosclerotic heart disease of native coronary artery without angina pectoris: Secondary | ICD-10-CM | POA: Diagnosis present

## 2023-06-07 DIAGNOSIS — I350 Nonrheumatic aortic (valve) stenosis: Secondary | ICD-10-CM | POA: Diagnosis present

## 2023-06-07 DIAGNOSIS — Z8249 Family history of ischemic heart disease and other diseases of the circulatory system: Secondary | ICD-10-CM | POA: Diagnosis not present

## 2023-06-07 DIAGNOSIS — I200 Unstable angina: Secondary | ICD-10-CM | POA: Diagnosis present

## 2023-06-07 DIAGNOSIS — I10 Essential (primary) hypertension: Secondary | ICD-10-CM | POA: Diagnosis not present

## 2023-06-07 LAB — NM PET CT CARDIAC PERFUSION MULTI W/ABSOLUTE BLOODFLOW
LV dias vol: 113 mL (ref 62–150)
LV sys vol: 33 mL
MBFR: 2.48
Nuc Rest EF: 60 %
Nuc Stress EF: 72 %
Peak HR: 92 {beats}/min
Rest HR: 76 {beats}/min
Rest MBF: 0.99 ml/g/min
Rest Nuclear Isotope Dose: 32.1 mCi
ST Depression (mm): 0 mm
Stress MBF: 2.46 ml/g/min
Stress Nuclear Isotope Dose: 32.2 mCi
TID: 1.03

## 2023-06-07 MED ORDER — REGADENOSON 0.4 MG/5ML IV SOLN
0.4000 mg | Freq: Once | INTRAVENOUS | Status: AC
  Administered 2023-06-07: 0.4 mg via INTRAVENOUS

## 2023-06-07 MED ORDER — RUBIDIUM RB82 GENERATOR (RUBYFILL)
32.2300 | PACK | Freq: Once | INTRAVENOUS | Status: AC
  Administered 2023-06-07: 32.23 via INTRAVENOUS

## 2023-06-07 MED ORDER — RUBIDIUM RB82 GENERATOR (RUBYFILL)
32.0700 | PACK | Freq: Once | INTRAVENOUS | Status: AC
  Administered 2023-06-07: 32.07 via INTRAVENOUS

## 2023-06-07 MED ORDER — REGADENOSON 0.4 MG/5ML IV SOLN
INTRAVENOUS | Status: AC
  Filled 2023-06-07: qty 5

## 2023-06-07 NOTE — Telephone Encounter (Signed)
   Patient Name: Michael Soto  DOB: 04-08-1961 MRN: [id number]  Primary Cardiologist: Donato Schultz, MD  Chart reviewed as part of pre-operative protocol coverage. Given past medical history and time since last visit, based on ACC/AHA guidelines, Michael Soto is at acceptable risk for the planned procedure without further cardiovascular testing.   Patient completed nuclear PET stress test that was low risk with no evidence of infarction.  He can hold his ASA 81 mg 7 days prior to procedure if indicated.  The patient was advised that if he develops new symptoms prior to surgery to contact our office to arrange for a follow-up visit, and he verbalized understanding.  I will route this recommendation to the requesting party via Epic fax function and remove from pre-op pool.  Please call with questions.  Napoleon Form, Leodis Rains, NP 06/07/2023, 6:19 PM

## 2023-06-20 ENCOUNTER — Other Ambulatory Visit (HOSPITAL_COMMUNITY): Payer: Self-pay

## 2023-06-20 ENCOUNTER — Ambulatory Visit: Payer: 59 | Attending: Internal Medicine | Admitting: Pharmacist

## 2023-06-20 ENCOUNTER — Telehealth: Payer: Self-pay | Admitting: Pharmacy Technician

## 2023-06-20 DIAGNOSIS — E7800 Pure hypercholesterolemia, unspecified: Secondary | ICD-10-CM | POA: Diagnosis not present

## 2023-06-20 NOTE — Telephone Encounter (Signed)
 Ran test claim for REPATHA. For a 28 day supply and the co-pay is 60.00 . PA is not needed at this time. Nothing saying this is a transition fill.   This test claim was processed through [facility]- copay amounts may vary at other pharmacies due to pharmacy/plan contracts, or as the patient moves through the different stages of their insurance plan.

## 2023-06-20 NOTE — Assessment & Plan Note (Addendum)
 Assessment: LDL-C is above goal of less than 70, ideally goal less than 55 Patient taking rosuvastatin 20 mg daily, never picked up ezetimibe Ezetimibe will not get him to goal either way Discussed PCSK9 inhibitor, injection technique, side effects, cost Encouraged patient to start exercising and increase more fruits and vegetables into his diet Handout on diet and exercise provided to patient  Plan: Submit prior authorization for Repatha Repeat labs in 3 months

## 2023-06-20 NOTE — Patient Instructions (Signed)

## 2023-06-20 NOTE — Progress Notes (Signed)
 Patient ID: Michael Soto                 DOB: Jan 26, 1961                    MRN: [id number]      HPI: Michael Soto is a 63 y.o. male patient referred to lipid clinic by Robin Searing, NP. PMH is significant for coronary artery calcifications, HTN, DM type II, hyperlipidemia, significant family history of premature CAD.   Due to his family history of premature CAD and nature of discomfort he was sent for coronary calcium score which showed aortic atherosclerosis with calcium score of 2416 with significant buildup in RCA and patient was placed on Crestor 20 mg with ezetimibe 10 mg added.   Patient presents today to lipid clinic.  Reports that he never picked up the Zetia.  Does admit that he has been taking the rosuvastatin.  Patient does not exercise.  Reports that he spends all of his time taking care of his parents, doing laundry, cleaning the floors and working.  He works from 4 PM to about 1 or 2 AM.  He eats a lot of of prepared and processed foods that are quick and easy to eat on the run.  Also eats a lot of peanut butter and jelly sandwiches.  Having rotator cuff surgery very soon.  We discussed the importance of taking care of himself proper nutrition and exercise in order to be there to take care of his parents.  Reviewed options for lowering LDL cholesterol, including ezetimibe, PCSK-9 inhibitors,   Discussed mechanisms of action, dosing, side effects and potential decreases in LDL cholesterol.  Also reviewed cost information and potential options for patient assistance.   Current Medications: rosuvastatin 20mg  daily Intolerances: None Risk Factors: Diabetes, hypertension, family history, coronary calcifications LDL-C goal: <70 (ideally less than 55) ApoB goal:  <80  Diet: sandwich, oodles noodle without seasoning packet, spagettio's, soup, PB sandwich, hot dogs, salad w/ canned chicken  Exercise: none  Family History:  Family History  Problem Relation Age of Onset   Other Mother         TRIPLE BYPASS   Diabetes Father      Social History: no ETOH, no tobacco Social History   Socioeconomic History   Marital status: Single    Spouse name: Not on file   Number of children: 0   Years of education: Not on file   Highest education level: Not on file  Occupational History   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, Surveyor, mining  Tobacco Use   Smoking status: Never   Smokeless tobacco: Never  Substance and Sexual Activity   Alcohol use: No   Drug use: No   Sexual activity: Not on file  Other Topics Concern   Not on file  Social History Narrative   Not on file   Social Drivers of Health   Financial Resource Strain: Not on file  Food Insecurity: Not on file  Transportation Needs: Not on file  Physical Activity: Not on file  Stress: Not on file  Social Connections: Not on file  Intimate Partner Violence: Not on file     Labs: Lipid Panel     Component Value Date/Time   CHOL 190 03/02/2023 1443   TRIG 125 03/02/2023 1443   HDL 34 (L) 03/02/2023 1443   CHOLHDL 5.6 (H) 03/02/2023 1443   LDLCALC 133 (H) 03/02/2023 1443   LABVLDL 23 03/02/2023 1443  Past Medical History:  Diagnosis Date   Acute pain of left shoulder due to trauma    Candidiasis of penis 08/25/2011   Chest discomfort 03/28/2017   Complication of anesthesia    slow to move knees after surgery   Diabetes mellitus without complication (HCC)    Essential hypertension 03/28/2017   External hemorrhoid 06/10/2011   History of kidney stones 2013   at least 2   Male genital ulcer 08/25/2011   Prediabetes    Pure hypercholesterolemia     Current Outpatient Medications on File Prior to Visit  Medication Sig Dispense Refill   losartan-hydrochlorothiazide (HYZAAR) 100-25 MG tablet TAKE 1 TABLET BY MOUTH DAILY 90 tablet 0   metFORMIN (GLUCOPHAGE) 500 MG tablet Take 500 mg by mouth daily.     Multiple Vitamins-Minerals (ZINC PO) Take by mouth.     OVER THE COUNTER MEDICATION Take 1 capsule by  mouth daily. VITAMIN C, D AND ZINC COMBINATION     rosuvastatin (CRESTOR) 20 MG tablet Take 1 tablet (20 mg total) by mouth daily. 90 tablet 3   Turmeric 450 MG CAPS Take by mouth.     vitamin D3 (CHOLECALCIFEROL) 25 MCG tablet Take by mouth.     aspirin EC 81 MG tablet Take 81 mg by mouth 3 (three) times a week. Swallow whole. (Patient not taking: Reported on 06/20/2023)     ezetimibe (ZETIA) 10 MG tablet Take 1 tablet (10 mg total) by mouth daily. (Patient not taking: Reported on 06/20/2023) 90 tablet 3   No current facility-administered medications on file prior to visit.    No Known Allergies  Assessment/Plan:  1. Hyperlipidemia -  Hypercholesterolemia Assessment: LDL-C is above goal of less than 70, ideally goal less than 55 Patient taking rosuvastatin 20 mg daily, never picked up ezetimibe Ezetimibe will not get him to goal either way Discussed PCSK9 inhibitor, injection technique, side effects, cost Encouraged patient to start exercising and increase more fruits and vegetables into his diet Handout on diet and exercise provided to patient  Plan: Submit prior authorization for Repatha Repeat labs in 3 months    Thank you,  Olene Floss, Pharm.D, BCACP, CPP [facility] [address], [place], [place] [address]  Phone: [phone]; Fax: [phone]

## 2023-06-22 MED ORDER — REPATHA SURECLICK 140 MG/ML ~~LOC~~ SOAJ
1.0000 mL | SUBCUTANEOUS | 11 refills | Status: AC
Start: 2023-06-22 — End: ?

## 2023-06-22 NOTE — Addendum Note (Signed)
 Addended by: Malena Peer D on: 06/22/2023 01:04 PM   Modules accepted: Orders

## 2023-06-23 ENCOUNTER — Telehealth: Payer: Self-pay | Admitting: Pharmacy Technician

## 2023-06-23 NOTE — Telephone Encounter (Signed)
    I called [facility] and gave them the coupon info. Now the copay is 15.00

## 2023-06-23 NOTE — Telephone Encounter (Signed)
 Try to call patient to let him know that Repatha was approved and that we called in a co-pay card for him.  Patient answered phone but stated now was not a good time and he would call me back.

## 2023-06-26 NOTE — Telephone Encounter (Signed)
 Patient did call back and spoke with scheduler. They relayed information to patient.

## 2023-07-21 ENCOUNTER — Ambulatory Visit: Payer: 59 | Admitting: Cardiology

## 2023-09-18 ENCOUNTER — Ambulatory Visit: Admitting: Nurse Practitioner
